# Patient Record
Sex: Female | Born: 1975 | Hispanic: Yes | Marital: Single | Smoking: Never smoker
Health system: Southern US, Community
[De-identification: ages and names within clinical notes are randomized; demographics above are authoritative.]

## PROBLEM LIST (undated history)

## (undated) ENCOUNTER — Emergency Department (HOSPITAL_COMMUNITY): Admission: EM | Payer: Self-pay | Source: Home / Self Care

## (undated) DIAGNOSIS — K219 Gastro-esophageal reflux disease without esophagitis: Secondary | ICD-10-CM

## (undated) DIAGNOSIS — M869 Osteomyelitis, unspecified: Secondary | ICD-10-CM

## (undated) DIAGNOSIS — O24419 Gestational diabetes mellitus in pregnancy, unspecified control: Secondary | ICD-10-CM

## (undated) HISTORY — DX: Osteomyelitis, unspecified: M86.9

## (undated) HISTORY — DX: Gestational diabetes mellitus in pregnancy, unspecified control: O24.419

## (undated) HISTORY — DX: Gastro-esophageal reflux disease without esophagitis: K21.9

## (undated) HISTORY — PX: LEG SURGERY: SHX1003

---

## 2008-02-03 HISTORY — PX: CHOLECYSTECTOMY: SHX55

## 2008-03-13 ENCOUNTER — Ambulatory Visit (HOSPITAL_COMMUNITY): Admission: RE | Admit: 2008-03-13 | Discharge: 2008-03-13 | Payer: Self-pay | Admitting: Obstetrics

## 2008-06-09 ENCOUNTER — Inpatient Hospital Stay (HOSPITAL_COMMUNITY): Admission: AD | Admit: 2008-06-09 | Discharge: 2008-06-13 | Payer: Self-pay | Admitting: Obstetrics

## 2008-12-03 ENCOUNTER — Encounter: Admission: RE | Admit: 2008-12-03 | Discharge: 2008-12-03 | Payer: Self-pay | Admitting: Specialist

## 2009-06-18 ENCOUNTER — Emergency Department (HOSPITAL_COMMUNITY): Admission: EM | Admit: 2009-06-18 | Discharge: 2009-06-19 | Payer: Self-pay | Admitting: Emergency Medicine

## 2009-06-24 ENCOUNTER — Inpatient Hospital Stay (HOSPITAL_COMMUNITY): Admission: EM | Admit: 2009-06-24 | Discharge: 2009-06-26 | Payer: Self-pay | Admitting: Emergency Medicine

## 2009-06-25 ENCOUNTER — Encounter (INDEPENDENT_AMBULATORY_CARE_PROVIDER_SITE_OTHER): Payer: Self-pay

## 2010-04-21 LAB — CBC
HCT: 37 % (ref 36.0–46.0)
HCT: 40.7 % (ref 36.0–46.0)
HCT: 41.8 % (ref 36.0–46.0)
Hemoglobin: 12.7 g/dL (ref 12.0–15.0)
Hemoglobin: 13.9 g/dL (ref 12.0–15.0)
MCHC: 34.1 g/dL (ref 30.0–36.0)
MCHC: 34.3 g/dL (ref 30.0–36.0)
MCV: 89 fL (ref 78.0–100.0)
MCV: 89.2 fL (ref 78.0–100.0)
Platelets: 281 10*3/uL (ref 150–400)
Platelets: 298 10*3/uL (ref 150–400)
Platelets: 306 10*3/uL (ref 150–400)
RBC: 4.68 MIL/uL (ref 3.87–5.11)
RDW: 12.8 % (ref 11.5–15.5)
RDW: 13 % (ref 11.5–15.5)
WBC: 12.1 10*3/uL — ABNORMAL HIGH (ref 4.0–10.5)
WBC: 14.3 10*3/uL — ABNORMAL HIGH (ref 4.0–10.5)

## 2010-04-21 LAB — COMPREHENSIVE METABOLIC PANEL
ALT: 10 U/L (ref 0–35)
ALT: 13 U/L (ref 0–35)
Albumin: 3.4 g/dL — ABNORMAL LOW (ref 3.5–5.2)
Alkaline Phosphatase: 65 U/L (ref 39–117)
Alkaline Phosphatase: 67 U/L (ref 39–117)
BUN: 3 mg/dL — ABNORMAL LOW (ref 6–23)
BUN: 4 mg/dL — ABNORMAL LOW (ref 6–23)
CO2: 25 mEq/L (ref 19–32)
Calcium: 8.6 mg/dL (ref 8.4–10.5)
Calcium: 8.7 mg/dL (ref 8.4–10.5)
Chloride: 110 mEq/L (ref 96–112)
GFR calc Af Amer: >60 mL/min (ref >60)
GFR calc Af Amer: >60 mL/min (ref >60)
GFR calc Af Amer: >60 mL/min (ref >60)
Glucose, Bld: 96 mg/dL (ref 70–99)
Potassium: 3.7 mEq/L (ref 3.5–5.1)
Sodium: 140 mEq/L (ref 135–145)
Total Bilirubin: 0.8 mg/dL (ref 0.3–1.2)
Total Bilirubin: 1 mg/dL (ref 0.3–1.2)
Total Protein: 6 g/dL (ref 6.0–8.3)
Total Protein: 6.4 g/dL (ref 6.0–8.3)

## 2010-04-21 LAB — POCT I-STAT, CHEM 8
BUN: 9 mg/dL (ref 6–23)
Calcium, Ion: 1.15 mmol/L (ref 1.12–1.32)
Chloride: 105 mEq/L (ref 96–112)
Creatinine, Ser: 0.5 mg/dL (ref 0.4–1.2)

## 2010-04-21 LAB — URINE MICROSCOPIC-ADD ON

## 2010-04-21 LAB — URINALYSIS, ROUTINE W REFLEX MICROSCOPIC
Bilirubin Urine: NEGATIVE
Glucose, UA: NEGATIVE mg/dL
Nitrite: NEGATIVE
Protein, ur: NEGATIVE mg/dL
Specific Gravity, Urine: 1.01 (ref 1.005–1.030)
Urobilinogen, UA: 0.2 mg/dL (ref 0.0–1.0)

## 2010-04-21 LAB — POCT PREGNANCY, URINE: Preg Test, Ur: NEGATIVE

## 2010-04-21 LAB — DIFFERENTIAL
Basophils Absolute: 0 10*3/uL (ref 0.0–0.1)
Basophils Relative: 0 % (ref 0–1)
Basophils Relative: 1 % (ref 0–1)
Eosinophils Relative: 1 % (ref 0–5)
Lymphs Abs: 2.3 10*3/uL (ref 0.7–4.0)
Monocytes Absolute: 0.8 10*3/uL (ref 0.1–1.0)
Neutro Abs: 7.5 10*3/uL (ref 1.7–7.7)
Neutrophils Relative %: 62 % (ref 43–77)

## 2010-04-21 LAB — LIPASE, BLOOD: Lipase: 28 U/L (ref 11–59)

## 2010-04-21 LAB — HEPATIC FUNCTION PANEL
Bilirubin, Direct: <0.1 mg/dL (ref 0.0–0.3)
Total Bilirubin: 0.5 mg/dL (ref 0.3–1.2)

## 2010-04-21 LAB — PREGNANCY, URINE: Preg Test, Ur: NEGATIVE

## 2010-05-13 LAB — CBC
Hemoglobin: 11.4 g/dL — ABNORMAL LOW (ref 12.0–15.0)
Hemoglobin: 12.9 g/dL (ref 12.0–15.0)
MCHC: 35.4 g/dL (ref 30.0–36.0)
MCHC: 35.5 g/dL (ref 30.0–36.0)
MCHC: 35.6 g/dL (ref 30.0–36.0)
MCV: 93.1 fL (ref 78.0–100.0)
MCV: 94.3 fL (ref 78.0–100.0)
MCV: 94.6 fL (ref 78.0–100.0)
Platelets: 227 10*3/uL (ref 150–400)
RBC: 3.29 MIL/uL — ABNORMAL LOW (ref 3.87–5.11)
RBC: 3.41 MIL/uL — ABNORMAL LOW (ref 3.87–5.11)
RDW: 12.9 % (ref 11.5–15.5)
WBC: 10.1 10*3/uL (ref 4.0–10.5)

## 2010-05-13 LAB — RPR: RPR Ser Ql: NONREACTIVE

## 2010-06-17 NOTE — Op Note (Signed)
NAME:  Monique Hutchinson, Monique Hutchinson NO.:  [id number]   MEDICAL RECORD NO.:  [id number]          PATIENT TYPE:  INP   LOCATION:  9311                          FACILITY:  [facility]   PHYSICIAN:  Kathreen Cosier, M.D.DATE OF BIRTH:  [birth year]   DATE OF PROCEDURE:  06/10/2008  DATE OF DISCHARGE:                               OPERATIVE REPORT   PREOPERATIVE DIAGNOSIS:  Failed induction at 41 weeks, failure to  progress in labor.   POSTOPERATIVE DIAGNOSIS:  Failed induction at 41 weeks, failure to  progress in labor.   SURGEON:  Kathreen Cosier, MD   ANESTHESIA:  Spinal.   PROCEDURE IN DETAIL:  The patient was placed on the operating room table  in supine position.  After the abdomen prepped and draped, bladder  emptied with Foley catheter, transverse suprapubic incision was made,  carried down to the rectus fascia.  Fascia was cleaned and incised at  the length of the incision.  Recti muscles retracted laterally.  Peritoneum incised longitudinally.  Transverse incision was made in the  visceral peritoneum above the bladder.  Bladder mobilized inferiorly.  Transverse lower uterine incision was made.  The patient delivered from  the OP position of a female, Apgar 9 and 9, weighing 5 pounds 4 ounces.  The placenta was posterior, removed manually and sent to Labor and  Delivery.  The uterine cavity was cleaned with dry laps.  Uterine  incision was closed in 1 layer with continuous suture of chromic.  Bladder flap reattached with 2-0 chromic.  Uterus well contracted.  Tubes and ovaries normal.  Abdomen was closed in layers, peritoneum with  continuous suture of 0 chromic, fascia with continuous suture of 0  Dexon, and the skin closed with subcuticular stitch of 4-0 plain.  Blood  loss 400 mL.  The patient tolerated the procedure well, taken to  recovery room in good condition.           ______________________________  Kathreen Cosier, M.D.     BAM/MEDQ  D:  06/11/2008   T:  06/11/2008  Job:  [id number]

## 2010-06-17 NOTE — H&P (Signed)
NAME:  ALAYLA, DETHLEFS NO.:  [id number]   MEDICAL RECORD NO.:  [id number]          PATIENT TYPE:  INP   LOCATION:  9128                          FACILITY:  [facility]   PHYSICIAN:  Kathreen Cosier, M.D.DATE OF BIRTH:  1975/09/09   DATE OF ADMISSION:  06/09/2008  DATE OF DISCHARGE:                              HISTORY & PHYSICAL   The patient is a 35 year old gravida 1, EDC Jun 03, 2008, was admitted at  41 weeks for induction.  The cervix was a fingertip, 50% in the vertex -  3, positive GBS.  Cytotec was inserted x2 and on May 9 at 6:30 a.m., she  was 2 cm, 70%, vertex, -3.  Membranes ruptured and IUPC inserted.  She  was on 18 milliunits of Pitocin over night and throughout the course of  the day, the patient was in adequate labor and by 11:00 p.m. on May 9,  she was still 3 cm, 90% with the vertex with a -3 station. There had  been no change and she has decided she would deliver by C-section for  failed induction, failure to progress in labor.   PHYSICAL EXAMINATION:  GENERAL:  A well-developed female in no distress.  HEENT:  Negative.  LUNGS:  Clear.  HEART:  Regular rhythm.  No murmurs, no gallops.  BREASTS:  No masses.  ABDOMEN:  Term size uterus.  EXTREMITIES:  She had previous surgery to her right shin, otherwise  negative.           ______________________________  Kathreen Cosier, M.D.     BAM/MEDQ  D:  06/11/2008  T:  06/11/2008  Job:  [id number]

## 2010-06-17 NOTE — Discharge Summary (Signed)
NAME:  Monique Hutchinson, SISTARE NO.:  [id number]   MEDICAL RECORD NO.:  [id number]          PATIENT TYPE:  INP   LOCATION:  9311                          FACILITY:  [facility]   PHYSICIAN:  Kathreen Cosier, M.D.DATE OF BIRTH:  [birth year]   DATE OF ADMISSION:  06/09/2008  DATE OF DISCHARGE:  06/13/2008                               DISCHARGE SUMMARY   The patient is a 35 year old gravida 1, EDC May 2, positive GBS,  admitted for induction on May 8.  Cervix was a fingertip, 50%, vertex, -  3.  Cytotec was inserted x2 and then she was started on Pitocin.  The  patient was in good labor and underwent primary low transverse cesarean  section because of failure to progress in labor.  She had a 5-pound 4-  ounce female, Apgar 9 and 9.  Postoperatively, she did well.  Her  hemoglobin was 11 and she was discharged on Jun 13, 2008, ambulatory on  a regular diet, on Tylox for pain, ferrous sulfate for anemia, to see me  in 6 weeks.   DISCHARGE DIAGNOSIS:  Status post primary low transverse cesarean  section at 41 weeks for failure to progress.           ______________________________  Kathreen Cosier, M.D.     BAM/MEDQ  D:  06/13/2008  T:  06/13/2008  Job:  [id number]

## 2012-02-03 NOTE — L&D Delivery Note (Signed)
Delivery Note At  a viable female was delivered via  (Presentation:LOA ;  ).  APGAR:6and8 NICU in attendance , ; weight .   Placenta status:spont via Duncan's mechanism , .  Cord:3 vc  with the following complications:none .  Cord pH: LOA  Anesthesia:  none Episiotomy: none Lacerations: none Suture Repair: n/a Est. Blood Loss (mL):   Mom to AICU.  Baby to NICU.  Monique Hutchinson,Monique Hutchinson 08/07/2012, 10:45 AM

## 2012-02-03 NOTE — L&D Delivery Note (Signed)
Attestation of Attending Supervision of Advanced Practitioner (CNM/NP): Evaluation and management procedures were performed by the Advanced Practitioner under my supervision and collaboration. I have reviewed the Advanced Practitioner's note and chart, and I agree with the management and plan.  Loreena Valeri H. 9:31 PM

## 2012-02-10 LAB — PREGNANCY, URINE: Preg Test, Ur: POSITIVE

## 2012-05-18 ENCOUNTER — Other Ambulatory Visit (HOSPITAL_COMMUNITY): Payer: Self-pay | Admitting: Physician Assistant

## 2012-05-18 DIAGNOSIS — Z3689 Encounter for other specified antenatal screening: Secondary | ICD-10-CM

## 2012-05-18 LAB — SICKLE CELL SCREEN: SICKLE CELL SCREEN: NORMAL

## 2012-05-18 LAB — OB RESULTS CONSOLE VARICELLA ZOSTER ANTIBODY, IGG
Varicella: IMMUNE
Varicella: IMMUNE

## 2012-05-18 LAB — GLUCOSE TOLERANCE, 1 HOUR (50G) W/O FASTING: GTT, 1 hr: 92

## 2012-05-18 LAB — OB RESULTS CONSOLE HIV ANTIBODY (ROUTINE TESTING): HIV: NONREACTIVE

## 2012-05-18 LAB — OB RESULTS CONSOLE PLATELET COUNT: Platelets: 262 10*3/uL

## 2012-05-19 LAB — OB RESULTS CONSOLE HEPATITIS B SURFACE ANTIGEN: Hepatitis B Surface Ag: NEGATIVE

## 2012-05-24 ENCOUNTER — Encounter (HOSPITAL_COMMUNITY): Payer: Self-pay

## 2012-05-24 ENCOUNTER — Ambulatory Visit (HOSPITAL_COMMUNITY)
Admission: RE | Admit: 2012-05-24 | Discharge: 2012-05-24 | Disposition: A | Discharge status: Home / Self Care | Payer: Self-pay | Source: Ambulatory Visit | Attending: Physician Assistant | Admitting: Physician Assistant

## 2012-05-24 DIAGNOSIS — O09529 Supervision of elderly multigravida, unspecified trimester: Secondary | ICD-10-CM | POA: Insufficient documentation

## 2012-05-24 DIAGNOSIS — Z1389 Encounter for screening for other disorder: Secondary | ICD-10-CM | POA: Insufficient documentation

## 2012-05-24 DIAGNOSIS — O358XX0 Maternal care for other (suspected) fetal abnormality and damage, not applicable or unspecified: Secondary | ICD-10-CM | POA: Insufficient documentation

## 2012-05-24 DIAGNOSIS — Z3689 Encounter for other specified antenatal screening: Secondary | ICD-10-CM

## 2012-05-24 DIAGNOSIS — Z363 Encounter for antenatal screening for malformations: Secondary | ICD-10-CM | POA: Insufficient documentation

## 2012-05-30 ENCOUNTER — Encounter: Payer: Self-pay | Admitting: *Deleted

## 2012-06-01 DIAGNOSIS — O09522 Supervision of elderly multigravida, second trimester: Secondary | ICD-10-CM

## 2012-06-01 DIAGNOSIS — O09529 Supervision of elderly multigravida, unspecified trimester: Secondary | ICD-10-CM | POA: Insufficient documentation

## 2012-06-01 DIAGNOSIS — O34219 Maternal care for unspecified type scar from previous cesarean delivery: Secondary | ICD-10-CM | POA: Insufficient documentation

## 2012-06-01 NOTE — Progress Notes (Signed)
Referred from health department Smoking screen done( negative), Domestic Violence screen done( negative), Psychosocial screen(negative)done,

## 2012-06-02 ENCOUNTER — Ambulatory Visit (INDEPENDENT_AMBULATORY_CARE_PROVIDER_SITE_OTHER): Payer: Self-pay | Admitting: Family Medicine

## 2012-06-02 ENCOUNTER — Encounter: Payer: Self-pay | Admitting: Family Medicine

## 2012-06-02 VITALS — BP 116/75 | Temp 97.4°F | Ht <= 58 in | Wt 130.4 lb

## 2012-06-02 DIAGNOSIS — M89669 Osteopathy after poliomyelitis, unspecified lower leg: Secondary | ICD-10-CM

## 2012-06-02 DIAGNOSIS — A809 Acute poliomyelitis, unspecified: Secondary | ICD-10-CM

## 2012-06-02 DIAGNOSIS — B91 Sequelae of poliomyelitis: Secondary | ICD-10-CM

## 2012-06-02 DIAGNOSIS — O09299 Supervision of pregnancy with other poor reproductive or obstetric history, unspecified trimester: Secondary | ICD-10-CM | POA: Insufficient documentation

## 2012-06-02 DIAGNOSIS — O09529 Supervision of elderly multigravida, unspecified trimester: Secondary | ICD-10-CM

## 2012-06-02 DIAGNOSIS — M869 Osteomyelitis, unspecified: Secondary | ICD-10-CM

## 2012-06-02 DIAGNOSIS — O09522 Supervision of elderly multigravida, second trimester: Secondary | ICD-10-CM

## 2012-06-02 DIAGNOSIS — O0990 Supervision of high risk pregnancy, unspecified, unspecified trimester: Secondary | ICD-10-CM | POA: Insufficient documentation

## 2012-06-02 DIAGNOSIS — O09292 Supervision of pregnancy with other poor reproductive or obstetric history, second trimester: Secondary | ICD-10-CM

## 2012-06-02 HISTORY — DX: Osteomyelitis, unspecified: M86.9

## 2012-06-02 LAB — POCT URINALYSIS DIP (DEVICE)
Hgb urine dipstick: NEGATIVE
Protein, ur: NEGATIVE mg/dL
Specific Gravity, Urine: 1.02 (ref 1.005–1.030)
Urobilinogen, UA: 0.2 mg/dL (ref 0.0–1.0)
pH: 7 (ref 5.0–8.0)

## 2012-06-02 NOTE — Patient Instructions (Addendum)
Embarazo - Segundo trimestre (Pregnancy - Second Trimester) El segundo trimestre del Psychiatrist (del 3 al ) es un perodo de evolucin rpida para usted y el beb. Hacia el final del sexto mes, el beb mide aproximadamente 23 cm y pesa 680 g. Comenzar a Pharmacologist del beb [place] 18 y las 20 [address] de [place]. Podr sentir las pataditas ("quickening en ingls"). Hay un rpido [employer]. Puede segregar un lquido claro Charity fundraiser) de las [place]. Quizs sienta pequeas contracciones en el vientre (tero) Esto se conoce como falso trabajo de parto o contracciones de Braxton-H[place]. Es como una prctica del trabajo de parto que se produce cuando el beb est listo para salir. Generalmente los problemas de vmitos matinales ya se han superado hacia el final del Medical sales representative. Algunas mujeres desarrollan pequeas manchas oscuras (que se denominan cloasma, mscara del embarazo) en la cara que normalmente se van luego del nacimiento del beb. La exposicin al sol empeora las manchas. Puede desarrollarse acn en algunas mujeres embarazadas, y puede desaparecer en aquellas que ya tienen acn. EXAMENES PRENATALES  Durante los Manpower Inc, deber seguir realizando pruebas de [place], segn avance el [place]. Estas pruebas se realizan para controlar su salud y la del beb. Tambin se realizan anlisis de sangre para [employer] niveles de [place]. La anemia (bajo nivel de hemoglobina) es frecuente durante el embarazo. Para prevenirla, se administran hierro y vitaminas. Tambin se le realizarn exmenes para saber si tiene diabetes entre las 24 y las 28 semanas del [place]. Podrn repetirle algunas de las Hovnanian Enterprises hicieron previamente.  En cada visita le medirn el tamao del tero. Esto se realiza para asegurarse de que el beb est creciendo correctamente de acuerdo al estado del [place].  Tambin en cada visita prenatal controlarn su presin arterial. Esto se realiza  para asegurarse de que no tenga toxemia.  Se controlar su orina para asegurarse de que no tenga infecciones, diabetes o protena en la orina.  Se controlar su peso regularmente para asegurarse que el aumento ocurre al ritmo indicado. Esto se hace para asegurarse que usted y el beb tienen una evolucin normal.  En algunas ocasiones se realiza una prueba de ultrasonido para confirmar el correcto desarrollo y evolucin del beb. Esta prueba se realiza con ondas sonoras inofensivas para el beb, de modo que el profesional pueda calcular ms precisamente la fecha del [place]. Algunas veces se realizan pruebas especializadas del lquido amnitico que rodea al beb. Esta prueba se denomina amniocentesis. El lquido amnitico se obtiene introduciendo una aguja en el vientre (abdomen). Se realiza para Conservator, museum/gallery en los que existe alguna preocupacin acerca de algn problema gentico que pueda sufrir el beb. En ocasiones se lleva a cabo cerca del final del embarazo, si es necesario inducir al Apple Computer. En este caso se realiza para asegurarse que los pulmones del beb estn lo suficientemente maduros como para que pueda vivir fuera del tero. CAMBIOS QUE OCURREN EN EL SEGUNDO TRIMESTRE DEL EMBARAZO Su organismo atravesar numerosos cambios durante el Big Lots. Estos pueden variar de Neomia Dear persona a otra. [place] con el profesional que la asiste acerca los cambios que usted note y que la preocupen.  Durante el segundo trimestre probablemente sienta un aumento del apetito. Es normal tener "antojos" de Development worker, community. Esto vara de Neomia Dear persona a otra y de un embarazo a Therapist, art.  El abdomen inferior comenzar a abultarse.  Podr tener la necesidad de Geographical information systems officer con ms frecuencia debido a que  el tero y el beb presionan sobre la vejiga. Tambin es frecuente contraer ms infecciones urinarias durante el embarazo (dolor al ConocoPhillips). Puede evitarlas bebiendo gran cantidad de lquidos y vaciando  la vejiga antes y despus de Sales promotion account executive.  Podrn aparecer las primeras estras en las caderas, abdomen y [place]. Estos son cambios normales del cuerpo durante el [place]. No existen medicamentos ni ejercicios que puedan prevenir CarMax.  Es posible que comience a desarrollar venas inflamadas y abultadas (varices) en las piernas. El uso de medias de descanso, Optometrist sus pies durante 15 minutos, 3 a 4 veces al da y Film/video editor la sal en su dieta ayuda a Journalist, newspaper.  Podr sentir Engineering geologist gstrica a medida que el tero crece y Doctor, general practice. Puede tomar anticidos, con la autorizacin de su mdico, para Financial planner. Tambin es til ingerir pequeas comidas 4 a 5 veces al Futures trader.  La constipacin puede tratarse con un laxante o agregando fibra a su dieta. Beber grandes cantidades de lquidos, comer vegetales, frutas y granos integrales es de [place].  Tambin es beneficioso practicar actividad fsica. Si ha sido una persona Engineer, mining, podr continuar con la Harley-Davidson de las actividades durante el mismo. Si ha sido [employer], puede ser beneficioso que comience con un programa de ejercicios, Museum/gallery exhibitions officer.  Puede desarrollar hemorroides (vrices en el recto) hacia el final del segundo trimestre. Tomar baos de asiento tibios y Chemical engineer cremas recomendadas por el profesional que lo asiste sern de ayuda para los problemas de hemorroides.  Tambin podr Financial risk analyst de espalda durante este momento de su embarazo. Evite levantar objetos pesados, utilice zapatos de taco bajo y [place] buena postura para ayudar a reducir los problemas de [place].  Algunas mujeres embarazadas desarrollan hormigueo y adormecimiento de la mano y los dedos debido a la hinchazn y compresin de los ligamentos de la mueca (sndrome del tnel carpiano). Esto desaparece una vez que el beb nace.  Como sus pechos se agrandan, Pension scheme manager un sujetador  ms grande. Use un sostn de soporte, cmodo y de algodn. No utilice un sostn para amamantar hasta el ltimo mes de embarazo si va a amamantar al beb.  Podr observar una lnea oscura desde el ombligo hacia la zona pbica denominada linea nigra.  Podr observar que sus mejillas se ponen coloradas debido al aumento de flujo sanguneo en la cara.  Podr desarrollar "araitas" en la cara, cuello y pecho. Esto desaparece una vez que el beb nace. INSTRUCCIONES PARA EL CUIDADO DOMICILIARIO  Es extremadamente importante que evite el cigarrillo, hierbas medicinales, alcohol y las drogas no prescriptas durante el Psychiatrist. Estas sustancias qumicas afectan la formacin y el desarrollo del beb. Evite estas sustancias durante todo el embarazo para asegurar el nacimiento de un beb sano.  La mayor parte de los cuidados que se aconsejan son los mismos que los indicados para Financial risk analyst trimestre del Psychiatrist. Cumpla con las citas tal como se le indic. Siga las instrucciones del profesional que lo asiste con respecto al uso de los medicamentos, el ejercicio y Psychologist, forensic.  Durante el embarazo debe obtener nutrientes para usted y para su beb. Consuma alimentos balanceados a intervalos regulares. Elija alimentos como carne, pescado, [place] y otros productos lcteos descremados, vegetales, frutas, panes integrales y cereales. El Equities trader cul es el aumento de peso ideal.  Las relaciones sexuales fsicas pueden continuarse hasta cerca del fin del embarazo si no existen otros problemas. Estos  problemas pueden ser la prdida temprana (prematura) de lquido amnitico de las [place], sangrado vaginal, dolor abdominal u otros problemas mdicos o del Psychiatrist.  Realice Tesoro Corporation, si no tiene restricciones. Consulte con el profesional que la asiste si no sabe con certeza si determinados ejercicios son seguros. El mayor aumento de peso tiene Environmental consultant durante los ltimos 2 trimestres del  Psychiatrist. El ejercicio la ayudar a:  Engineering geologist.  Ponerla en forma para el parto.  Ayudarla a perder peso luego de haber dado a luz.  Use un buen sostn o como los que se usan para hacer deportes para Paramedic la sensibilidad de las [place]. Tambin puede serle til si lo [place] mientras duerme. Si pierde Product manager, podr Parker Hann[employer]in.  No utilice la baera con agua caliente, baos turcos y saunas durante el [address]  Utilice el cinturn de seguridad sin excepcin cuando conduzca. Este la proteger a usted y al beb en caso de accidente.  Evite comer carne cruda, queso crudo, y el contacto con los utensilios y desperdicios de los gatos. Estos elementos contienen grmenes que pueden causar defectos de nacimiento en el beb.  El segundo trimestre es un buen momento para visitar a su dentista y Software engineer si an no lo ha hecho. Es Primary school teacher los dientes limpios. Utilice un cepillo de dientes blando. Cepllese ms suavemente durante el embarazo.  Es ms fcil perder algo de orina durante el [place]. Apretar y Chief Operating Officer los msculos de la pelvis la ayudar con este problema. Practique detener la miccin cuando est en el bao. Estos son los mismos msculos que Development worker, international aid. Son TEPP[employer] Partners mismos msculos que utiliza cuando trata de Ryder System gases. Puede practicar apretando estos msculos 10 veces, y repetir esto 3 veces por da aproximadamente. Una vez que conozca qu msculos debe apretar, no realice estos ejercicios durante la miccin. Puede favorecerle una infeccin si la orina vuelve hacia atrs.  Pida ayuda si tiene necesidades econmicas, de asesoramiento o nutricionales durante el [place]. El profesional podr ayudarla con respecto a estas necesidades, o derivarla a otros especialistas.  La piel puede ponerse grasa. Si esto sucede, lvese la cara con un jabn [place], utilice un humectante no graso y [place] con base de aceite o  crema. CONSUMO DE MEDICAMENTOS Y DROGAS DURANTE EL EMBARAZO  Contine tomando las vitaminas apropiadas para esta etapa tal como se le indic. Las vitaminas deben contener un miligramo de cido flico y deben suplementarse con hierro. Guarde todas las vitaminas fuera del alcance de los nios. La ingestin de slo un par de vitaminas o tabletas que contengan hierro puede ocasionar la New[employer] Mining en un beb o en un nio pequeo.  Evite el uso de [place], inclusive los de venta libre y hierbas que no hayan sido prescriptos o indicados por el profesional que la asiste. Algunos medicamentos pueden causar problemas fsicos al beb. Utilice los medicamentos de venta libre o de prescripcin para Chief Technology Officer, Environmental health practitioner o la [place], segn se lo indique el profesional que lo asiste. No utilice aspirina.  El consumo de alcohol est relacionado con ciertos defectos de nacimiento. Esto incluye el sndrome de alcoholismo fetal. Debe evitar el consumo de alcohol en cualquiera de sus formas. El cigarrillo causa nacimientos prematuros y bebs de [place]. El uso de drogas recreativas est absolutamente prohibido. Son muy nocivas para el beb. Un beb que nace de American Express, ser adicto al nacer. Ese beb tendr los mismos  sntomas de abstinencia que un adulto.  Infrmele al profesional si consume alguna droga.  No consuma drogas ilegales. Pueden causarle mucho dao al beb. SOLICITE ATENCIN MDICA SI: Tiene preguntas o preocupaciones durante su embarazo. Es mejor que llame para Science writer las dudas que esperar hasta su prxima visita prenatal. Thressa Sheller forma se sentir ms tranquila.  SOLICITE ATENCIN MDICA DE INMEDIATO SI:  La temperatura oral se eleva sin motivo por encima de 102 F (38.9 C) o segn le indique el profesional que lo asiste.  Tiene una prdida de lquido por la vagina (canal de parto). Si sospecha una ruptura de las Fabrica, tmese la temperatura y llame al profesional para informarlo sobre  esto.  Observa unas pequeas manchas, una hemorragia vaginal o elimina cogulos. Notifique al profesional acerca de la cantidad y de cuntos apsitos est utilizando. Unas pequeas manchas de sangre son algo comn durante el Psychiatrist, especialmente despus de Sales promotion account executive.  Presenta un olor desagradable en la secrecin vaginal y observa un cambio en el color, de transparente a blanco.  Contina con las nuseas y no obtiene alivio de los remedios indicados. Vomita sangre o algo similar a la borra del caf.  [place] o sube ms de 900 g. en una semana, o segn lo indicado por el profesional que la asiste.  Observa que se le Southwest Airlines, las manos, los pies o las piernas.  Ha estado expuesta a la rubola y no ha sufrido la enfermedad.  Ha estado expuesta a la quinta enfermedad o a la varicela.  Presenta dolor abdominal. Las molestias en el ligamento redondo son Neomia Dear causa no cancerosa (benigna) frecuente de dolor abdominal durante el embarazo. El profesional que la asiste deber evaluarla.  Presenta dolor de cabeza intenso que no se [place].  Presenta fiebre, diarrea, dolor al orinar o le falta la respiracin.  Presenta dificultad para ver, visin borrosa, o visin doble.  Sufre una cada, un accidente de trnsito o cualquier tipo de trauma.  Vive en un hogar en el que existe violencia fsica o mental. Document Released: 10/29/2004 Document Revised: 04/13/2011 [facility] Patient Information 2013 [place], [place].  Lactancia materna  (Breastfeeding) Decidir Museum/gallery exhibitions officer es una de las mejores elecciones que puede hacer por usted y su beb. La informacin que se brinda a Psychologist, clinical dar una breve visin de los beneficios de la lactancia materna as como de las dudas ms frecuentes alrededor de ella.  LOS BENEFICIOS DE AMAMANTAR  Para el beb   La primera leche (calostro ) ayuda al mejor funcionamiento del sistema digestivo del beb.   La leche tiene anticuerpos que  provienen de la madre y que ayudan a prevenir las infecciones en el beb.   El beb tiene una menor incidencia de asma, alergias y del sndrome de muerte sbita del lactante (SMSL).   Los nutrientes en la [place] materna son mejores para el beb que los preparados para lactantes y la [place] materna ayuda a un mejor desarrollo del cerebro del beb.   Los bebs amamantados tienen menos gases, clicos y estreimiento.  Para la mam   La lactancia materna favorece el desarrollo de un vnculo muy especial entre la madre y el beb.   Es ms conveniente, siempre disponible y a Landscape architect y Film/video editor.   Consume caloras en la madre y la ayuda a perder el peso ganado durante el [place].   Favorece la contraccin del tero a su tamao normal, de manera ms rpida y [employer]away las hemorragias luego del  parto.   Las [employer].C[employer] Holdings que amamantan tienen menor riesgo de Geophysical data processor de mama.  FRECUENCIA DEL AMAMANTAMIENTO   Un beb sano, nacido a trmino, puede amamantarse con tanta frecuencia como cada hora, o espaciar las comidas cada tres horas.   Observe al beb cuando manifieste signos de hambre. Amamante a su beb si muestra signos de hambre. Esta frecuencia variar de un beb a otro.   Amamntelo tan seguido como el beb lo solicite, o cuando usted sienta la necesidad de Paramedic sus [place].   Despierte al beb si han pasado 3  4 horas desde la ltima comida.   El amamantamiento frecuente la ayudar a producir ms [place] y a Education officer, community de Engineer, mining en los pezones e hinchazn de las [place].  POSICIN DEL BEBE PARA EL AMAMANTAMIENTO   Ya sea que se encuentre [place] o sentada, asegrese que el abdomen del beb enfrente el suyo.   Sostenga la mama con el pulgar por arriba y los otros 4 dedos por debajo. Asegrese que sus dedos se encuentren lejos del pezn y de la boca del beb.   Empuje suavemente los labios del beb con el pezn o con el dedo.   Cuando la boca  del beb se abra lo suficiente, introduzca el pezn y la areola tanto como le sea posible dentro de la boca.   Coloque al beb cerca suyo de modo que su nariz y mejillas toquen las mamas al [employer].  ALIMENTACIN Y SUCCIN   La duracin de cada comida vara de un beb a otro y de Neomia Dear comida a [place].   El beb debe succionar entre 2 y 3 minutos para que Development worker, international aid. Esto se denomina "bajada". Por este motivo, permita que el nio se alimente en cada mama todo lo que desee. Terminar de mamar cuando haya recibido la cantidad [place] de nutrientes.   Para detener la succin coloque su dedo en la comisura de la boca del nio y Midwife entre sus encas antes de quitarle la mama de la boca. Esto la ayudar a English as a second language teacher.  COMO SABER SI EL BEB OBTIENE LA SUFICIENTE LECHE MATERNA  Preguntarse si el beb obtiene la cantidad suficiente de [place] es una preocupacin frecuente [employer]. Puede asegurarse que el beb tiene la leche suficiente si:   El beb succiona activamente y usted escucha que traga .   El beb parece estar relajado y satisfecho despus de Psychologist, clinical.   El nio se alimenta al menos 8 a 12 veces en 24 horas. Alimntelo hasta que se desprenda por sus propios medios o se quede dormido en la primera mama (al menos durante 10 a 20 minutos), luego ofrzcale el otro lado.   El beb moja 5 a 6 paales desechables (6 a 8 paales de tela) en 24 horas cuando tiene 5  6 das de vida.   Tiene al Lowe's Companies 3 a 4 deposiciones todos los B[employer]on[employer] Dickinson and Company primeros meses. La materia fecal debe ser blanda y [place].   El beb debe aumentar 4 a 6 libras (120 a 170 gr.) por semana despus de los 4 [address] de vida.   Siente que las mamas se ablandan despus de amamantar  REDUCIR LA CONGESTIN DE LAS MAMAS   Durante la primera semana despus del [place], usted puede experimentar hinchazn en las mamas. Cuando las mamas estn congestionadas, se sienten calientes, llenas y  molestas al tacto. Puede reducir la congestin si:   Lo amamanta frecuentemente, cada 2-3  horas. Las mams que [employer] Corporation pronto y con frecuencia tienen menos problemas de [place].   Coloque compresas de hielo en sus mamas durante 10-20 minutos entre cada amamantamiento. Esto ayuda a Building services engineer. Envuelva las bolsas de hielo en una toalla liviana para proteger su piel. Las bolsas de vegetales congelados funcionan bien para este propsito.   Tome una ducha tibia o aplique compresas hmedas calientes en las mamas durante 5 a 10 minutos antes de cada vez que [place]. Esto aumenta la circulacin y [place] a que la [place].   Masajee suavemente la mama antes y Psychologist, sport and exercise. Con las puntas de los dedos, masajee desde la pared torcica hacia abajo hasta llegar al pezn, con movimientos circulares.   Asegrese que el nio vaca al menos una mama antes de cambiar de lado.   Use un sacaleche para vaciar la mama si el beb se duerme o no se alimenta bien. Tambin podr Phelps Dodge con esa bomba si tiene que volver al trabajo o siente que las mamas estn congestionadas.   Evite los biberones, chupetes o complementar la alimentacin con agua o jugos en lugar de la [place]. La leche materna es todo el alimento que el beb necesita. No es necesario que el nio ingiera agua o preparados de bibern. Louann Liv, es lo mejor para ayudar a que las mamas produzcan ms [place]. no darle suplementos al Bank [employer] America las primeras semanas.   Verifique que el beb se encuentra en la posicin correcta mientras lo alimenta.   Use un sostn que soporte bien sus mamas y State [employer] Corporation que tienen aro.   Consuma una dieta balanceada y beba lquidos en cantidad.   Descanse con frecuencia, reljese y tome sus vitaminas prenatales para evitar la fatiga, el estrs y la anemia.  Si sigue estas indicaciones, la congestin debe mejorar en 24 a 48 horas. Si an tiene dificultades, consulte a Nurse, adult.  CUDESE USTED MISMA  Cuide sus mamas.   Bese o dchese diariamente.   Evite usar [employer]aton Corporation.   Comience a amamantar del lado izquierdo en una comida y del lado derecho en la siguiente.   Notar que aumenta el flujo de [place] a los 2 a 5 [address] despus del [address]. Puede sentir algunas molestias por la congestin, lo que hace que sus mamas estn duras y sensibles. La congestin disminuye en 24 a 48 horas. Mientras tanto, aplique toallas hmedas calientes durante 5 a 10 minutos antes de amamantar. Un masaje suave y la extraccin de un poco de leche antes de Museum/gallery exhibitions officer ablandarn las mamas y har ms fcil que el beb se agarre.   Use un buen sostn y seque al aire los pezones durante 3 a 4 minutos luego de Wellsite geologist.   Solo utilice apsitos de algodn.   Utilice lanolina [employer] International pezones luego de [place]. No necesita lavarlos luego de alimentar al [employer]raw[employer]Hill. Otra opcin es exprimir algunas gotas de [place] y [employer] Holdings pezones.  Cumpla con estos cuidados   Consuma alimentos bien balanceados y refrigerios nutritivos.   Dixie Dials, jugos de fruta y agua para Warehouse manager sed (alrededor de 8 vasos por Futures trader).   Descanse lo suficiente.  Evite los alimentos que usted note que pueden afectar al beb.  SOLICITE ATENCIN MDICA SI:   Tiene dificultad con la lactancia materna y [place].   Tiene una zona de color rojo, dura y dolorosa en la mama que se acompaa  de fiebre.   El beb est muy somnoliento como para alimentarse bien o tiene problemas para dormir.   Su beb moja menos de 6 paales al [address], a los 5 [address]  [address] de vida.   La piel del beb o la parte blanca de sus ojos est ms amarilla de lo que estaba en el hospital.   Se siente deprimida.  Document Released: 01/19/2005 Document Revised: 07/21/2011 [facility] Patient Information 2013 [place], [place]. Parto vaginal luego de una cesrea (Vaginal Birth After Cesarean  Delivery) Un parto vaginal luego de un parto por cesrea es dar a luz por la vagina luego de haber dado a luz por medio de una intervencin [place]. En el pasado, si una mujer tena un beb por cesrea, todos los partos posteriores deban hacerse por cesrea. Esto ya no es as. Puede ser seguro para la mam intentar un parto vaginal luego de una cesrea. La decisin final de tener un parto vaginal o por cesrea debe tomarse en conjunto, entre la paciente y el mdico. [place] riesgos y los beneficios debern evaluarse con relacin a los motivos y al tipo de cesrea previa LAS MUJERES QUE QUIEREN TENER UN PARTO VAGINAL, DEBEN CONSULTAR CON SU MDICO PARA ASEGURARSE QUE:  La cesrea anterior se realiz con una incisin uterina transversal (no con una incisin vertical clsica).  El canal de parto es lo suficientemente grande como para que pase el [place].  No ha sido sometida a otras operaciones del tero.  Durante el trabajo de parto le realizarn un monitoreo electrnico fetal, en todo momento.  Es necesario que haya un quirfano disponible y listo en caso de necesitar una cesrea de emergencia.  Un cirujano y personal de quirfano estarn disponibles en todo momento durante el [place] de parto, para realizar una cesrea en caso de ser necesario.  Habr un anestesista disponible en caso de necesitar una cesrea de emergencia.  La nursery est lista cuenta con personal especializado y el equipo disponible para cuidar al beb en caso de emergencia. BENEFICIOS  Permanencia ms breve en el hospital.  Menores costos en el parto, la nurse y el hospital.  Menos prdida de sangre y menos probabilidad de necesitar una transfusin sangunea.  Menos probabilidad de tener fiebre o molestias como consecuencia de una ciruga mayor  Menos riesgo de cogulos sanguneos.  Menos riesgo de sufrir infecciones.  Recuperacin ms rpida luego del alta mdica.  Menos riesgo de complicaciones quirrgicas, como  apertura o hernia de la incisin.  Disminucin del riesgo de lesiones a otros rganos  Menor riesgo de remocin del tero (histerectoma)  Menor riesgo de que la placenta cubra parcial o completamente la abertura del tero (placenta previa) en embarazos futuros  Posibilidad de tener una familia grande, si lo desea. RIESGOS  Ruptura del tero.  Si el tero se rompe Production manager.  Todas las complicaciones de [place] mayor y lesiones en otros rganos.  Hemorragia excesiva, cogulos e infeccin.  [place] Apgar Puntuacin Apgar baja (mtodo que evala al recin nacido segn su apariencia, pulso, muecas, actividad y respiracion) y ms riesgos para el beb.  Hay mas riesgo de ruptura del tero si se induce o aumenta el trabajo de [place].  Hay un mayor riesgo de ruptura uterina si se usan medicamentos para madurar el cuello. NO DEBE LLEVARSE A CABO SI:  La cesrea previa se realiz con una incicin vertical (clsica) o con forma de T, o usted no sabe cul de [employer] han practicado.  Ha sufrido ruptura  del tero.  Le han practicado una ciruga de tero.  Tiene problemas mdicos u obsttricos.  El beb est en problemas.  Tuvo dos cesreas previas y ningn parto vaginal. OTRAS COSAS QUE DEBE SABER:  La anestesia peridural es segura.  Es seguro dar vuelta al beb si se encuentra de nalgas (intentar una versin ceflica externa).  Es seguro intentarlo en caso de mellizos.  Los embarazos de ms de 40 semanas no tienen xito con este procedimiento.  Hay un aumento de fracasos en embarazadas obesas.  Hay un aumento del porcentaje de fracasos si el beb pesa 4 Kg o ms.  Hay aumento en el porcentaje de fracasos si el intervalo entre la operacin cesrea y el parto vaginal es de menos de 19 meses.  Hay un aumento en el porcentaje de fracasos si ha sufrido preeclampsia hipertensin arterial, protenas en la orina e hinchazn del rostro y las extremidades.  El  parto vaginal ser muy exitoso si tuvo un parto vaginal previo.  Tambin es [employer] [facility] caso que el [place] de parto comience espontneamente antes de la fecha.  El parto vaginal luego de Neomia Dear cesrea es similar a un parto espontneo vaginal normal. Es importante que converse con su mdico desde comienzos del Psychiatrist de modo que pueda [employer], beneficios y opciones. De este modo tendr tiempo de decidir que es lo mejor en su caso particular en relacin a su parto por cesrea anterior. Hay que tener en cuenta que puede haber cambios en la madre durante el [place], lo que hace necesario cambiar su decisin o la del mdico. Los consejos, preocupaciones y decisiones debern documentarse en la historia clnica y debe ser firmada por todas las partes. Document Released: 07/08/2007 Document Revised: 04/13/2011 [facility] Patient Information 2013 [place], [place].

## 2012-06-03 NOTE — Progress Notes (Signed)
Transfer from HD for h/o SGA, previous C-section, for failed induction on OP baby, thought to be 41 wks, but really only 37, with SGA baby and due date change. Considering TOLAC.  Watch FH, baby 1wk behind at anatomy U/S.

## 2012-06-08 ENCOUNTER — Encounter: Payer: Self-pay | Admitting: *Deleted

## 2012-06-08 DIAGNOSIS — O34219 Maternal care for unspecified type scar from previous cesarean delivery: Secondary | ICD-10-CM

## 2012-06-08 DIAGNOSIS — O09292 Supervision of pregnancy with other poor reproductive or obstetric history, second trimester: Secondary | ICD-10-CM

## 2012-06-08 DIAGNOSIS — O09522 Supervision of elderly multigravida, second trimester: Secondary | ICD-10-CM

## 2012-06-08 DIAGNOSIS — O0990 Supervision of high risk pregnancy, unspecified, unspecified trimester: Secondary | ICD-10-CM

## 2012-06-23 ENCOUNTER — Ambulatory Visit (INDEPENDENT_AMBULATORY_CARE_PROVIDER_SITE_OTHER): Payer: Self-pay | Admitting: Family Medicine

## 2012-06-23 VITALS — BP 126/65 | Temp 97.0°F | Wt 134.1 lb

## 2012-06-23 DIAGNOSIS — O0990 Supervision of high risk pregnancy, unspecified, unspecified trimester: Secondary | ICD-10-CM

## 2012-06-23 DIAGNOSIS — O09529 Supervision of elderly multigravida, unspecified trimester: Secondary | ICD-10-CM

## 2012-06-23 DIAGNOSIS — IMO0002 Reserved for concepts with insufficient information to code with codable children: Secondary | ICD-10-CM

## 2012-06-23 DIAGNOSIS — O09523 Supervision of elderly multigravida, third trimester: Secondary | ICD-10-CM

## 2012-06-23 DIAGNOSIS — O365990 Maternal care for other known or suspected poor fetal growth, unspecified trimester, not applicable or unspecified: Secondary | ICD-10-CM

## 2012-06-23 DIAGNOSIS — Z8759 Personal history of other complications of pregnancy, childbirth and the puerperium: Secondary | ICD-10-CM | POA: Insufficient documentation

## 2012-06-23 LAB — CBC
HCT: 32.7 % — ABNORMAL LOW (ref 36.0–46.0)
Hemoglobin: 11 g/dL — ABNORMAL LOW (ref 12.0–15.0)
RBC: 3.98 MIL/uL (ref 3.87–5.11)
WBC: 8.6 10*3/uL (ref 4.0–10.5)

## 2012-06-23 LAB — POCT URINALYSIS DIP (DEVICE)
Bilirubin Urine: NEGATIVE
Hgb urine dipstick: NEGATIVE
Ketones, ur: NEGATIVE mg/dL
Specific Gravity, Urine: 1.025 (ref 1.005–1.030)
pH: 7 (ref 5.0–8.0)

## 2012-06-23 NOTE — Progress Notes (Signed)
U/S scheduled 06/28/12 at 815 am.

## 2012-06-23 NOTE — Progress Notes (Signed)
Right ankle swelling, goes away when walking but when sitting it swells worse. Swelling and numbness in hands.

## 2012-06-23 NOTE — Patient Instructions (Addendum)
Embarazo  Tercer trimestre  (Pregnancy - Third Trimester) El tercer trimestre del embarazo (los ltimos 3 meses) es el perodo en el cual tanto usted como su beb crecen con ms rapidez. El beb alcanza un largo de aproximadamente 50 cm. y pesa entre 2,700 y 4,500 kg. El beb gana ms tejido graso y est listo para la vida fuera del cuerpo de la madre. Mientras estn en el interior, los bebs tienen perodos de sueo y vigilia, succionan el pulgar y tienen hipo. Quizs sienta pequeas contracciones del tero. Este es el falso trabajo de parto. Tambin se las conoce como contracciones de Braxton-Hicks . Es como una prctica del parto. Los problemas ms habituales de esta etapa del embarazo incluyen mayor dificultad para respirar, hinchazn de las manos y los pies por retencin de lquidos y la necesidad de orinar con ms frecuencia debido a que el tero y el beb presionan sobre la vejiga.  EXAMENES PRENATALES   Durante los exmenes prenatales, deber seguir realizndose anlisis de sangre. Estas pruebas se realizan para controlar su salud y la del beb. Los anlisis de sangre se realizan para conocer los niveles de algunos compuestos de la sangre (hemoglobina). La anemia (bajo nivel de hemoglobina) es frecuente durante el embarazo. Para prevenirla, se administran hierro y vitaminas. Tambin le tomarn nuevas anlisis para descartar diabetes. Podrn repetirle algunas de las pruebas que le hicieron previamente.  En cada visita le medirn el tamao del tero. Esto permite asegurar que el beb se desarrolla adecuadamente, segn la fecha del embarazo.  Le controlarn la presin arterial en cada visita prenatal. Esto es para asegurarse de que no sufre toxemia.  Le harn un anlisis de orina en cada visita prenatal, para descartar infecciones, diabetes y la presencia de protenas.  Tambin en cada visita controlarn su peso. Esto se realiza para asegurarse que aumenta de peso al ritmo indicado y que usted y su  beb evolucionan normalmente.  En algunas ocasiones se realiza una prueba de ultrasonido para confirmar el correcto desarrollo y evolucin del beb. Esta prueba se realiza con ondas sonoras inofensivas para el beb, de modo que el profesional pueda calcular ms precisamente la fecha del parto.  Analice con su mdico los analgsicos y la anestesia que recibir durante el trabajo de parto y el parto.  Comente la posibilidad de que necesite una cesrea y qu anestesia se recibir.  Informe a su mdico si sufre violencia familiar mental o fsica. A veces, se indica la prueba especializada sin estrs, la prueba de tolerancia a las contracciones y el perfil biofsico para asegurarse de que el beb no tiene problemas. El estudio del lquido amnitico que rodea al beb se llama amniocentesis. El lquido amnitico se obtiene introduciendo una aguja en el vientre (abdomen ). En ocasiones se lleva a cabo cerca del final del embarazo, si es necesario inducir a un parto. En este caso se realiza para asegurarse que los pulmones del beb estn lo suficientemente maduros como para que pueda vivir fuera del tero. Si los pulmones no han madurado y es peligroso que el beb nazca, se administrar a la madre una inyeccin de cortisona , 1 a 2 das antes del parto. . Esto ayuda a que los pulmones del beb maduren y sea ms seguro su nacimiento.  CAMBIOS QUE OCURREN EN EL TERCER TRIMESTRE DEL EMBARAZO  Su organismo atravesar numerosos cambios durante el embarazo. Estos pueden variar de una persona a otra. Converse con el profesional que la asiste acerca los cambios que   usted note y que la preocupen.   Durante el ltimo trimestre probablemente sienta un aumento del apetito. Es normal tener "antojos" de ciertas comidas. Esto vara de una persona a otra y de un embarazo a otro.  Podrn aparecer las primeras estras en las caderas, abdomen y mamas. Estos son cambios normales del cuerpo durante el embarazo. No existen  medicamentos ni ejercicios que puedan prevenir estos cambios.  La constipacin puede tratarse con un laxante o agregando fibra a su dieta. Beber grandes cantidades de lquidos, tomar fibras en forma de vegetales, frutas y granos integrales es de gran ayuda.  Tambin es beneficioso practicar actividad fsica. Si ha sido una persona activa hasta el embarazo, podr continuar con la mayora de las actividades durante el mismo. Si ha sido menos activa, puede ser beneficioso que comience con un programa de ejercicios, como realizar caminatas. Consulte con el profesional que la asiste antes de comenzar un programa de ejercicios.  Evite el consumo de cigarrillos, el alcohol, los medicamentos no recetados y las "drogas de la calle" durante el embarazo. Estas sustancias qumicas afectan la formacin y el desarrollo del beb. Evite estas sustancias durante todo el embarazo para asegurar el nacimiento de un beb sano.  Podr sentir dolor de espalda, tener vrices en las venas y hemorroides, o si ya los sufra, pueden empeorar.  Durante el tercer trimestre se cansar con ms facilidad, lo cual es normal.  Los movimientos del beb pueden ser ms fuertes y con ms frecuencia.  Puede que note dificultades para respirar normalmente.  El ombligo puede salir hacia afuera.  A veces sale una secrecin amarilla de las mamas, que se llama calostro.  Podr aparecer una secrecin mucosa con sangre. Esto suele ocurrir entre unos pocos das y una semana antes del parto. INSTRUCCIONES PARA EL CUIDADO EN EL HOGAR   Cumpla con las citas de control. Siga las indicaciones del mdico con respecto al uso de medicamentos, los ejercicios y la dieta.  Durante el embarazo debe obtener nutrientes para usted y para su beb. Consuma alimentos balanceados a intervalos regulares. Elija alimentos como carne, pescado, leche y otros productos lcteos descremados, vegetales, frutas, panes integrales y cereales. El mdico le informar  cul es el aumento de peso ideal.  Las relaciones sexuales pueden continuarse hasta casi el final del embarazo, si no se presentan otros problemas como prdida prematura (antes de tiempo) de lquido amnitico, hemorragia vaginal o dolor en el vientre (abdominal).  Realice actividad fsica todos los das, si no tiene restricciones. Consulte con el profesional que la asiste si no sabe con certeza si determinados ejercicios son seguros. El mayor aumento de peso se producir en los ltimos 2 trimestres del embarazo. El ejercicio ayuda a:  Controlar su peso.  Mantenerse en forma para el trabajo de parto y el parto .  Perder peso despus del parto.  Haga reposo con frecuencia, con las piernas elevadas, o segn lo necesite para evitar los calambres y el dolor de cintura.  Use un buen sostn o como los que se usan para hacer deportes para aliviar la sensibilidad de las mamas. Tambin puede serle til si lo usa mientras duerme. Si pierde calostro, podr utilizar apsitos en el sostn.  No utilice la baera con agua caliente, baos turcos y saunas.   Colquese el cinturn de seguridad cuando conduzca. Este la proteger a usted y al beb en caso de accidente.  Evite comer carne cruda y el contacto con los utensilios y desperdicios de los gatos. Estos   elementos contienen grmenes que pueden causar defectos de nacimiento en el beb.  Es fcil perder algo de orina durante el embarazo. Apretar y fortalecer los msculos de la pelvis la ayudar con este problema. Practique detener la miccin cuando est en el bao. Estos son los mismos msculos que necesita fortalecer. Son tambin los mismos msculos que utiliza cuando trata de evitar despedir gases. Puede practicar apretando estos msculos diez veces, y repetir esto tres veces por da aproximadamente. Una vez que conozca qu msculos debe apretar, no realice estos ejercicios durante la miccin. Puede favorecerle una infeccin si la orina vuelve hacia  atrs.  Pida ayuda si tienen necesidades financieras, teraputicas o nutricionales. El profesional podr ayudarla con respecto a estas necesidades, o derivarla a otros especialistas.  Haga una lista de nmeros telefnicos de emergencia y tngalos disponibles.  Planifique como obtener ayuda de familiares o amigos cuando regrese a casa desde el hospital.  Hacer un ensayo sobre la partida al hospital.  Tome clases prenatales con el padre para entender, practicar y hacer preguntas sobre el trabajo de parto y el alumbramiento.  Preparar la habitacin del beb / busque una guardera.  No viaje fuera de la ciudad a menos que sea absolutamente necesario y con el asesoramiento de su mdico.  Use slo zapatos de tacn bajo o sin tacn para tener mejor equilibrio y evitar cadas. USO DE MEDICAMENTOS Y CONSUMO DE DROGAS DURANTE EL EMBARAZO   Tome las vitaminas apropiadas para esta etapa tal como se le indic. Las vitaminas deben contener un miligramo de cido flico. Guarde todas las vitaminas fuera del alcance de los nios. La ingestin de slo un par de vitaminas o tabletas que contengan hierro pueden ocasionar la muerte en un beb o en un nio pequeo.  Evite el uso de todos los medicamentos, incluyendo hierbas, medicamentos de venta libre, sin receta o que no hayan sido sugeridos por su mdico. Slo tome medicamentos de venta libre o medicamentos recetados para el dolor, el malestar o fiebre como lo indique su mdico. No tome aspirina, ibuprofeno o naproxeno excepto que su mdico se lo indique.  Infrmele al profesional si consume alguna droga.  El alcohol se relaciona con ciertos defectos congnitos. Incluye el sndrome de alcoholismo fetal. Debe evitar absolutamente el consumo de alcohol, en cualquier forma. El fumar produce baja tasa de natalidad y bebs prematuros.  Las drogas ilegales o de la calle son muy perjudiciales para el beb. Estn absolutamente prohibidas. Un beb que nace de una  madre adicta, ser adicto al nacer. Ese beb tendr los mismos sntomas de abstinencia que un adulto. SOLICITE ATENCIN MDICA SI:  Tiene preguntas o preocupaciones relacionadas con el embarazo. Es mejor que llame para formular las preguntas si no puede esperar hasta la prxima visita, que sentirse preocupada por ellas.  SOLICITE ATENCIN MDICA DE INMEDIATO SI:   La temperatura oral le sube a ms de 38,9 C (102 F) o lo que su mdico le indique.  Tiene una prdida de lquido por la vagina (canal de parto). Si sospecha una ruptura de las membranas, tmese la temperatura y llame al profesional para informarlo sobre esto.  Observa unas pequeas manchas, una hemorragia vaginal o elimina cogulos. Notifique al profesional acerca de la cantidad y de cuntos apsitos est utilizando.  Presenta un olor desagradable en la secrecin vaginal y observa un cambio en el color, de transparente a blanco.  Ha vomitado durante ms de 24 horas.  Siente escalofros o le sube la fiebre.  Le   falta el aire.  Siente ardor al orinar.  Baja o sube ms de 2 libras (900 g), o segn lo indicado por el profesional que la asiste.  Observa que sbitamente se le hinchan el rostro, las manos, los pies o las piernas.  Siente dolor en el vientre (abdominal). Las molestias en el ligamento redondo son una causa benigna frecuente de dolor abdominal durante el embarazo. El profesional que la asiste deber evaluarla.  Presenta dolor de cabeza intenso que no se alivia.  Tiene problemas visuales, visin doble o borrosa.  Si no siente los movimientos del beb durante ms de 1 hora. Si piensa que el beb no se mueve tanto como lo haca habitualmente, coma algo que contenga azcar y recustese sobre el lado izquierdo durante una hora. El beb debe moverse al menos 4  5 veces por hora. Comunquese inmediatamente si el beb se mueve menos que lo indicado.  Se cae, se ve involucrada en un accidente automovilstico o sufre algn  tipo de traumatismo.  En su hogar hay violencia mental o fsica. Document Released: 10/29/2004 Document Revised: 10/14/2011 [employer] Patient Information 2014 [employer].  

## 2012-06-23 NOTE — Progress Notes (Signed)
Monique Hutchinson is a 37 y.o. female G2P1001 at [redacted]w[redacted]d who presents today for a high-risk OB follow-up.  She complains of right ankle swelling, goes away when walking but when sitting it swells worse. She also has swelling and numbness in hands. She denies any cramping or contractions, denies vaginal discharge or bleeding.  She feels the baby move regularly.  She is tolerating food and drink without nausea or vomiting.

## 2012-06-24 LAB — RPR

## 2012-06-24 NOTE — Progress Notes (Signed)
I saw and examined patient and agree with student note. FH small for dates. Likely constitutional but will get f/u U/S for growth and AFI. No other concerns.

## 2012-06-25 ENCOUNTER — Encounter: Payer: Self-pay | Admitting: Family Medicine

## 2012-06-28 ENCOUNTER — Ambulatory Visit (HOSPITAL_COMMUNITY)
Admission: RE | Admit: 2012-06-28 | Discharge: 2012-06-28 | Disposition: A | Discharge status: Home / Self Care | Payer: Self-pay | Source: Ambulatory Visit | Attending: Family Medicine | Admitting: Family Medicine

## 2012-06-28 DIAGNOSIS — O34219 Maternal care for unspecified type scar from previous cesarean delivery: Secondary | ICD-10-CM | POA: Insufficient documentation

## 2012-06-28 DIAGNOSIS — IMO0002 Reserved for concepts with insufficient information to code with codable children: Secondary | ICD-10-CM

## 2012-06-28 DIAGNOSIS — O09529 Supervision of elderly multigravida, unspecified trimester: Secondary | ICD-10-CM | POA: Insufficient documentation

## 2012-06-28 DIAGNOSIS — O365990 Maternal care for other known or suspected poor fetal growth, unspecified trimester, not applicable or unspecified: Secondary | ICD-10-CM | POA: Insufficient documentation

## 2012-07-07 ENCOUNTER — Ambulatory Visit (INDEPENDENT_AMBULATORY_CARE_PROVIDER_SITE_OTHER): Payer: Self-pay | Admitting: Obstetrics & Gynecology

## 2012-07-07 VITALS — BP 129/83 | Temp 98.0°F | Wt 134.4 lb

## 2012-07-07 DIAGNOSIS — O0990 Supervision of high risk pregnancy, unspecified, unspecified trimester: Secondary | ICD-10-CM

## 2012-07-07 DIAGNOSIS — O34219 Maternal care for unspecified type scar from previous cesarean delivery: Secondary | ICD-10-CM

## 2012-07-07 LAB — POCT URINALYSIS DIP (DEVICE)
Bilirubin Urine: NEGATIVE
Ketones, ur: NEGATIVE mg/dL
Protein, ur: NEGATIVE mg/dL
Specific Gravity, Urine: 1.025 (ref 1.005–1.030)
pH: 7 (ref 5.0–8.0)

## 2012-07-07 NOTE — Progress Notes (Signed)
Discussed VBAC vs. TOLAC, given information, will sign consent next visit  C/O hand and arm pain c/w possible CTS.  Can try hand braces

## 2012-07-07 NOTE — Patient Instructions (Addendum)
Embarazo  Systems analyst trimestre  (Pregnancy - Third Trimester) El tercer trimestre del Psychiatrist (los ltimos 3 meses) es el perodo en el cual tanto usted como su beb crecen con ms rapidez. El beb alcanza un largo de aproximadamente 50 cm. y pesa entre 2,700 y 4,500 kg. El beb gana ms tejido graso y est listo para la vida fuera del cuerpo de la [place]. Mientras estn en el interior, los bebs tienen perodos de sueo y vigilia, Warehouse manager y tienen hipo. Quizs sienta pequeas contracciones del tero. Este es el falso trabajo de [place]. Tambin se las conoce como contracciones de [place] . Es como una prctica del parto. Los problemas ms habituales de esta etapa del embarazo incluyen mayor dificultad para respirar, hinchazn de las manos y los pies por retencin de lquidos y la necesidad de Geographical information systems officer con ms frecuencia debido a que el tero y el beb presionan sobre la vejiga.  EXAMENES PRENATALES   Durante los Manpower Inc, deber seguir realizndose anlisis de [place]. Estas pruebas se realizan para controlar su salud y la del beb. Los AR[employer]ARK Corporation de sangre se Radiographer, therapeutic para [employer] niveles de algunos compuestos de la sangre (hemoglobina). La anemia (bajo nivel de hemoglobina) es frecuente durante el embarazo. Para prevenirla, se administran hierro y vitaminas. Tambin le tomarn nuevas anlisis para descartar diabetes. Podrn repetirle algunas de las Hovnan[employer] Enterprises hicieron previamente.  En cada visita le medirn el tamao del tero. Esto permite asegurar que el beb se desarrolla adecuadamente, segn la fecha del embarazo.  Le controlarn la presin arterial en cada visita prenatal. Esto es para asegurarse de que no sufre toxemia.  Le harn un anlisis de orina en cada visita prenatal, para descartar infecciones, diabetes y la presencia de protenas.  Tambin en cada visita controlarn su peso. Esto se realiza para asegurarse que aumenta de peso al ritmo indicado y que usted y su  beb evolucionan normalmente.  En algunas ocasiones se realiza una prueba de ultrasonido para confirmar el correcto desarrollo y evolucin del beb. Esta prueba se realiza con ondas sonoras inofensivas para el beb, de modo que el profesional pueda calcular ms precisamente la fecha del [place].  Analice con su mdico los analgsicos y la anestesia que recibir durante el [place] de parto y [place].  Comente la posibilidad de que necesite una cesrea y qu anestesia se recibir.  Informe a su mdico si sufre violencia familiar mental o fsica. A veces, se indica la prueba especializada sin estrs, la prueba de tolerancia a las contracciones y el perfil biofsico para asegurarse de que el beb no tiene problemas. El estudio del lquido amnitico que rodea al beb se llama amniocentesis. El lquido amnitico se obtiene introduciendo una aguja en el vientre (abdomen ). En ocasiones se lleva a cabo cerca del final del embarazo, si es necesario inducir a un parto. En este caso se realiza para asegurarse que los pulmones del beb estn lo suficientemente maduros como para que pueda vivir fuera del tero. Si los pulmones no han madurado y es peligroso que el beb nazca, se Building services engineer a la madre una inyeccin de [place] , 1 a 2 [address]  [address] antes del [address]. Vivia Budge ayuda a que los pulmones del beb maduren y sea ms seguro su nacimiento.  CAMBIOS QUE OCURREN EN EL TERCER TRIMESTRE DEL EMBARAZO  Su organismo atravesar numerosos cambios durante el [place]. Estos pueden variar de Neomia Dear persona a otra. Converse con el profesional que la asiste acerca los cambios que  usted note y que la preocupen.   Durante el ltimo trimestre probablemente sienta un aumento del apetito. Es normal tener "antojos" de Development worker, community. Esto vara de Neomia Dear persona a otra y de un embarazo a Therapist, art.  Podrn aparecer las primeras estras en las caderas, abdomen y [place]. Estos son cambios normales del cuerpo durante el [place]. No existen  medicamentos ni ejercicios que puedan prevenir [employer]Max.  La constipacin puede tratarse con un laxante o agregando fibra a su dieta. Beber grandes cantidades de lquidos, tomar fibras en forma de vegetales, frutas y granos integrales es de gran [place].  Tambin es beneficioso practicar actividad fsica. Si ha sido una persona Engineer, mining, podr continuar con la Harley[employer]son de las actividades durante el mismo. Si ha sido [employer], puede ser beneficioso que comience con un programa de ejercicios, Museum/gallery exhibitions officer. Consulte con el profesional que la asiste antes de comenzar un programa de ejercicios.  Evite el consumo de cigarrillos, el alcohol, los medicamentos no recetados y las "drogas de la calle" durante el Psychiatrist. Estas sustancias qumicas afectan la formacin y el desarrollo del beb. Evite estas sustancias durante todo el embarazo para asegurar el nacimiento de un beb sano.  Podr sentir dolor de espalda, tener vrices en las venas y hemorroides, o si ya los sufra, pueden [place].  Durante el tercer trimestre se cansar con ms facilidad, lo cual es normal.  Los movimientos del beb pueden ser ms fuertes y con ms frecuencia.  Puede que note dificultades para respirar normalmente.  El ombligo puede salir hacia afuera.  A veces sale Veterinary surgeon de las [place], que se llama Product manager.  Podr aparecer Neomia Dear secrecin mucosa con sangre. Esto suele ocurrir General Electric unos [address] y Neomia Dear semana antes del [place]. INSTRUCCIONES PARA EL CUIDADO EN EL HOGAR   Cumpla con las citas de control. Siga las indicaciones del mdico con respecto al uso de [place], los ejercicios y la dieta.  Durante el embarazo debe obtener nutrientes para usted y para su beb. Consuma alimentos balanceados a intervalos regulares. Elija alimentos como carne, pescado, [place] y otros productos lcteos descremados, vegetales, frutas, panes integrales y cereales. El Office Depot informar  cul es el aumento de peso ideal.  Las relaciones sexuales pueden continuarse hasta casi el final del embarazo, si no se presentan otros problemas como prdida prematura (antes de [place]) de lquido amnitico, hemorragia vaginal o dolor en el vientre (abdominal).  Realice Tesoro Corporation, si no tiene restricciones. Consulte con el profesional que la asiste si no sabe con certeza si determinados ejercicios son seguros. El mayor aumento de peso se producir en los ltimos 2 trimestres del Psychiatrist. El ejercicio ayuda a:  Engineering geologist.  Mantenerse en forma para el trabajo de parto y [place] .  Perder peso despus del parto.  Haga reposo con frecuencia, con las piernas elevadas, o segn lo necesite para evitar los calambres y el dolor de cintura.  Use un buen sostn o como los que se usan para hacer deportes para Paramedic la sensibilidad de las [place]. Tambin puede serle til si lo [place] mientras duerme. Si pierde Product manager, podr Parker Hann[employer]in.  No utilice la baera con agua caliente, baos turcos y saunas.   Colquese el cinturn de seguridad cuando conduzca. Este la proteger a usted y al beb en caso de accidente.  Evite comer carne cruda y el contacto con los utensilios y desperdicios de los gatos. Estos  elementos contienen grmenes que pueden causar defectos de nacimiento en el beb.  Es fcil perder algo de orina durante el [employer]. Apretar y Chief Operating Officer los msculos de la pelvis la ayudar con este problema. Practique detener la miccin cuando est en el bao. Estos son los mismos msculos que Development worker, international aid. Son [employer]PP[employer] Partners mismos msculos que utiliza cuando trata de evitar despedir gases. Puede practicar apretando estos msculos WellPoint, y repetir esto tres veces por da aproximadamente. Una vez que conozca qu msculos debe apretar, no realice estos ejercicios durante la miccin. Puede favorecerle una infeccin si la orina vuelve hacia  atrs.  Pida ayuda si tienen necesidades financieras, teraputicas o nutricionales. El profesional podr ayudarla con respecto a estas necesidades, o derivarla a otros especialistas.  Haga una lista de nmeros telefnicos de emergencia y tngalos disponibles.  Planifique como obtener ayuda de familiares o amigos cuando regrese a Programmer, applications hospital.  Hacer un ensayo sobre la partida al hospital.  [place] clases prenatales con el padre para entender, practicar y hacer preguntas sobre el Jolane Bankhead de parto y el alumbramiento.  Preparar la habitacin del beb / busque Fatima Blank.  No viaje fuera de la ciudad a menos que sea absolutamente necesario y con el asesoramiento de su mdico.  Use slo zapatos de tacn bajo o sin tacn para tener mejor equilibrio y Automotive engineer cadas. USO DE MEDICAMENTOS Y CONSUMO DE DROGAS DURANTE EL [facility]   Tome las vitaminas apropiadas para esta etapa tal como se le indic. Las vitaminas deben contener un miligramo de cido flico. Guarde todas las vitaminas fuera del alcance de los nios. La ingestin de slo un par de vitaminas o tabletas que contengan hierro pueden ocasionar la New[employer] Mining en un beb o en un nio pequeo.  Evite el uso de The Mutual of Omaha, incluyendo hierbas, medicamentos de [place], sin receta o que no hayan sido sugeridos por su mdico. Slo tome medicamentos de venta libre o medicamentos recetados para Chief Technology Officer, Environmental health practitioner o fiebre como lo indique su mdico. No tome aspirina, ibuprofeno o naproxeno excepto que su mdico se lo indique.  Infrmele al profesional si consume alguna droga.  El alcohol se relaciona con ciertos defectos congnitos. Incluye el sndrome de alcoholismo fetal. Debe evitar absolutamente el consumo de alcohol, en cualquier forma. El fumar produce baja tasa de natalidad y bebs prematuros.  Las drogas ilegales o de la calle son muy perjudiciales para el beb. Estn absolutamente prohibidas. Un beb que nace de [employer] Energy, ser adicto al nacer. Ese beb tendr los mismos sntomas de abstinencia que un adulto. SOLICITE ATENCIN MDICA SI:  Tiene preguntas o preocupaciones relacionadas con el embarazo. Es mejor que llame para formular las preguntas si no puede esperar hasta la prxima visita, que sentirse preocupada por ellas.  SOLICITE ATENCIN MDICA DE INMEDIATO SI:   La temperatura oral le sube a ms de 38,9 C (102 F) o lo que su mdico le indique.  Tiene una prdida de lquido por la vagina (canal de parto). Si sospecha una ruptura de las [place], tmese la temperatura y llame al profesional para informarlo sobre esto.  Observa unas pequeas manchas, una hemorragia vaginal o elimina cogulos. Notifique al profesional acerca de la cantidad y de cuntos apsitos est utilizando.  Presenta un olor desagradable en la secrecin vaginal y observa un cambio en el color, de transparente a blanco.  Ha vomitado durante ms de 24 horas.  Siente escalofros o le sube la fiebre.  Conley Rolls  falta el aire.  Siente ardor al Beatrix Shipper.  [place]aja o sube ms de 2 libras (900 g), o segn lo indicado por el profesional que la asiste.  Observa que sbitamente se le hinchan el rostro, las manos, los pies o las piernas.  Siente dolor en el vientre (abdominal). Las Federal[employer]Mogul en el ligamento redondo son Neomia Dear causa benigna frecuente de dolor abdominal durante el embarazo. El profesional que la asiste deber evaluarla.  Presenta dolor de cabeza intenso que no se [place].  Tiene problemas visuales, visin doble o borrosa.  Si no siente los movimientos del beb durante ms de 1 hora. Si piensa que el beb no se mueve tanto como lo haca habitualmente, coma algo que Psychologist, clinical y Target Corporation lado izquierdo durante [place]. El beb debe moverse al menos 4  5 veces por hora. Comunquese inmediatamente si el beb se mueve menos que lo indicado.  Se cae, se ve involucrada en un accidente automovilstico o sufre algn  tipo de traumatismo.  En su hogar hay violencia mental o fsica. Document Released: 10/29/2004 Document Revised: 10/14/2011 [facility] Patient Information 2014 [place], [place]. Parto vaginal luego de una cesrea (Vaginal Birth After Cesarean Delivery) Un parto vaginal luego de un parto por cesrea es dar a luz por la vagina luego de haber dado a luz por medio de una intervencin [place]. En el pasado, si una mujer tena un beb por cesrea, todos los partos posteriores deban hacerse por cesrea. Esto ya no es as. Puede ser seguro para la mam intentar un parto vaginal luego de una cesrea. La decisin final de tener un parto vaginal o por cesrea debe tomarse en conjunto, entre la paciente y el mdico. [place] riesgos y los beneficios debern evaluarse con relacin a los motivos y al tipo de cesrea previa LAS MUJERES QUE QUIEREN TENER UN PARTO VAGINAL, DEBEN CONSULTAR CON SU MDICO PARA ASEGURARSE QUE:  La cesrea anterior se realiz con una incisin uterina transversal (no con una incisin vertical clsica).  El canal de parto es lo suficientemente grande como para que pase el [place].  No ha sido sometida a otras operaciones del tero.  Durante el trabajo de parto le realizarn un monitoreo electrnico fetal, en todo momento.  Es necesario que haya un quirfano disponible y listo en caso de necesitar una cesrea de emergencia.  Un cirujano y personal de quirfano estarn disponibles en todo momento durante el [place] de parto, para realizar una cesrea en caso de ser necesario.  Habr un anestesista disponible en caso de necesitar una cesrea de emergencia.  La nursery est lista cuenta con personal especializado y el equipo disponible para cuidar al beb en caso de emergencia. BENEFICIOS  Permanencia ms breve en el hospital.  Menores costos en el parto, la nurse y el hospital.  Menos prdida de sangre y menos probabilidad de necesitar una transfusin sangunea.  Menos probabilidad  de tener fiebre o molestias como consecuencia de una ciruga mayor  Menos riesgo de cogulos sanguneos.  Menos riesgo de sufrir infecciones.  Recuperacin ms rpida luego del alta mdica.  Menos riesgo de complicaciones quirrgicas, como apertura o hernia de la incisin.  Disminucin del riesgo de lesiones a otros rganos  Menor riesgo de remocin del tero (histerectoma)  Menor riesgo de que la placenta cubra parcial o completamente la abertura del tero (placenta previa) en embarazos futuros  Posibilidad de tener una familia grande, si lo desea. RIESGOS  Ruptura del tero.  Si el tero se rompe le  realizarn una histerectoma.  Todas las complicaciones de [place] mayor y lesiones en otros rganos.  Hemorragia excesiva, cogulos e infeccin.  [place] Puntuacin Apgar baja (mtodo que evala al recin nacido segn su apariencia, pulso, muecas, actividad y respiracion) y ms riesgos para el beb.  Hay mas riesgo de ruptura del tero si se induce o aumenta el trabajo de [place].  Hay un mayor riesgo de ruptura uterina si se usan medicamentos para madurar el cuello. NO DEBE LLEVARSE A CABO SI:  La cesrea previa se realiz con una incicin vertical (clsica) o con forma de T, o usted no sabe cul de Luc[employer] han practicado.  Ha sufrido ruptura del tero.  Le han practicado una ciruga de tero.  Tiene problemas mdicos u obsttricos.  El beb est en problemas.  Tuvo dos cesreas previas y ningn parto vaginal. OTRAS COSAS QUE DEBE SABER:  La anestesia peridural es segura.  Es seguro dar vuelta al beb si se encuentra de nalgas (intentar una versin ceflica externa).  Es seguro intentarlo en caso de mellizos.  Los embarazos de ms de 40 semanas no tienen xito con este procedimiento.  Hay un aumento de fracasos en embarazadas obesas.  Hay un aumento del porcentaje de fracasos si el beb pesa 4 Kg o ms.  Hay aumento en el porcentaje de fracasos si el  intervalo entre la operacin cesrea y el parto vaginal es de menos de 19 meses.  Hay un aumento en el porcentaje de fracasos si ha sufrido preeclampsia hipertensin arterial, protenas en la orina e hinchazn del rostro y las extremidades.  El parto vaginal ser muy exitoso si tuvo un parto vaginal previo.  Tambin es [employer] caso que el [place] de parto comience espontneamente antes de la fecha.  El parto vaginal luego de Neomia Dear cesrea es similar a un parto espontneo vaginal normal. Es importante que converse con su mdico desde comienzos del Psychiatrist de modo que pueda Google, beneficios y opciones. De este modo tendr tiempo de decidir que es lo mejor en su caso particular en relacin a su parto por cesrea anterior. Hay que tener en cuenta que puede haber cambios en la madre durante el [place], lo que hace necesario cambiar su decisin o la del mdico. Los consejos, preocupaciones y decisiones debern documentarse en la historia clnica y debe ser firmada por todas las partes. Document Released: 07/08/2007 Document Revised: 04/13/2011 [facility] Patient Information 2014 [place], [place].

## 2012-07-07 NOTE — Progress Notes (Signed)
Pulse- 76  Edema-hands/feet  Pain-cramps, lower abd

## 2012-07-21 ENCOUNTER — Encounter: Payer: Self-pay | Admitting: Obstetrics & Gynecology

## 2012-07-21 ENCOUNTER — Ambulatory Visit (INDEPENDENT_AMBULATORY_CARE_PROVIDER_SITE_OTHER): Payer: Self-pay | Admitting: Obstetrics & Gynecology

## 2012-07-21 VITALS — BP 135/82 | Temp 97.5°F | Wt 137.1 lb

## 2012-07-21 DIAGNOSIS — O0990 Supervision of high risk pregnancy, unspecified, unspecified trimester: Secondary | ICD-10-CM

## 2012-07-21 DIAGNOSIS — O09529 Supervision of elderly multigravida, unspecified trimester: Secondary | ICD-10-CM

## 2012-07-21 DIAGNOSIS — O09523 Supervision of elderly multigravida, third trimester: Secondary | ICD-10-CM

## 2012-07-21 LAB — POCT URINALYSIS DIP (DEVICE)
Hgb urine dipstick: NEGATIVE
Ketones, ur: NEGATIVE mg/dL
Protein, ur: 30 mg/dL — AB
Specific Gravity, Urine: 1.025 (ref 1.005–1.030)
Urobilinogen, UA: 0.2 mg/dL (ref 0.0–1.0)

## 2012-07-21 NOTE — Progress Notes (Signed)
Pulse: 72

## 2012-07-21 NOTE — Progress Notes (Signed)
VBAC consent signed. 

## 2012-07-21 NOTE — Patient Instructions (Signed)
Eleccin del mtodo anticonceptivo  (Contraception Choices) La anticoncepcin (control de la natalidad) es el uso de cualquier mtodo o dispositivo para Location manager. A continuacin se indican algunos de esos mtodos.  MTODOS HORMONALES   Implante anticonceptivo. Es un tubo plstico delgado que contiene la hormona progesterona. No contiene estrgenos. El mdico inserta el tubo en la parte interna del brazo. El tubo puede Geneticist, molecular durante 3 aos. Despus de los 3 aos debe retirarse. El implante impide que los ovarios liberen vulos (ovulacin), espesa el moco cervical, lo que evita que los espermatozoides ingresen al tero y hace ms delgada la membrana que cubre el interior del tero.  Inyecciones de progesterona sola. Estas inyecciones se administran cada 3 meses para evitar el embarazo. La progesterona sinttica impide que los ovarios liberen vulos. Tambin hace que el moco cervical se espese y modifica el recubrimiento interno del tero. Esto hace ms difcil que los espermatozoides sobrevivan en el tero.  Pldoras anticonceptivas. Las pldoras anticonceptivas contienen estrgenos y Education officer, museum. Actan impidiendo que el vulo se forme en el ovario(ovulacin). Las pldoras anticonceptivas son recetadas por el mdico.Tambin se utilizan para tratar los perodos menstruales abundantes.  Minipldora. Este tipo de pldora anticonceptiva contiene slo hormona progesterona. Deben tomarse todos los [address]  [address] del mes y debe recetarlas el mdico.  Parches anticonceptivos. El parche contiene hormonas similares a las que contienen las pldoras anticonceptivas. Deben cambiarse una vez por semana y se utilizan bajo prescripcin mdica.  Anillo vaginal. Anillo vaginal contiene hormonas similares a las que contienen las pldoras anticonceptivas. Se deja colocado durante tres semanas, se lo retira durante 1 semana y luego se coloca uno nuevo. La paciente debe sentirse cmoda para insertar y  retirar el anillo de la vagina.Es necesaria la receta del mdico.  Anticonceptivos de Associate Professor. Los anticonceptivos de emergencia son mtodos para evitar un embarazo despus de una relacin sexual sin proteccin. Esta pldora puede tomarse inmediatamente despus de Child psychotherapist sexuales o hasta 5 [place] de haber tenido sexo sin proteccin. Es ms efectiva si se toma poco tiempo despus. Los anticonceptivos de emergencia estn disponibles sin prescripcin mdica. Consltelo con su farmacutico. No use los anticonceptivos de emergencia como nico mtodo anticonceptivo. MTODOS DE BARRERA   Condn masculino. Es una vaina delgada (ltex o goma) que se [place] en el pene durante el acto sexual. Deri Fuelling con espermicida para aumentar la efectividad.  Condn femenino. Es una vaina blanda y floja que se adapta suavemente a la vagina antes de las relaciones sexuales.  Diafragma. Es una barrera de ltex redonda y Casimer Bilis que debe ser ajustada por un profesional. Se inserta en la vagina, junto con un gel espermicida. Debe insertarse antes de Management consultant. Debe dejar el diafragma colocado en la vagina durante 6 a 8 horas despus de la relacin sexual.  Capuchn cervical. Es una taza de ltex o plstico, redonda y [place] que cubre el cuello del tero y debe ser ajustada por un mdico. Puede dejarlo colocado en la vagina hasta 48 horas despus de las Clinical research associate.  Esponja. Es una pieza blanda y circular de espuma de poliuretano. Contiene un espermicida. Se inserta en la vagina despus de mojarla y antes de las [employer] St[employer].  Espermicidas. Los espermicidas son qumicos que matan o bloquean el esperma y no lo dejan ingresar al cuello del tero y al tero. Vienen en forma de cremas, geles, supositorios, espuma o comprimidos. No es necesario tener Emergency planning/management officer. Se insertan en la vagina con un aplicador  antes de News Corporation. El proceso debe repetirse cada vez que tiene  relaciones sexuales. ANTICONCEPTIVOS INTRAUTERINOS   Dispositivo intrauterino (DIU). Es un dispositivo en forma de T que se coloca en el tero durante el perodo menstrual, para Location manager. Hay dos tipos:  DIU de cobre. Este tipo de DIU est recubierto con un alambre de cobre y se inserta dentro del tero. El cobre hace que el tero y las trompas de Falopio produzcan un liquido que [employer] Stores espermatozoides. Puede permanecer colocado durante 10 aos.  DIU hormonal. Este tipo de DIU contiene la hormona progestina (progesterona sinttica). La hormona espesa el moco cervical y evita que los espermatozoides ingresen al tero y tambin afina la membrana que cubre el tero para evitar la implantacin del vulo fertilizado. La hormona debilita o destruye los espermatozoides que ingresan al tero. Puede permanecer colocado durante 5 aos. MTODOS ANTICONCEPTIVOS PERMANENTES   Ligadura de trompas en la mujer. La ligadura de trompas en la mujer se realiza sellando, atando u obstruyendo quirrgicamente las trompas de Falopio lo que impide que el vulo descienda hacia el tero.  Esterilizacin masculina. Se realiza atando los conductos por los que pasan los espermatozoides (vasectoma).Esto impide que el esperma ingrese a la vagina durante el acto sexual. Luego del procedimiento, el hombre puede eyacular lquido (semen). MTODOS DE PLANIFICACIN NATURAL   Planificacin familiar natural.  Consiste en no tener relaciones sexuales o usar un mtodo de barrera (condn, [place], capuchn cervical) en los [employer] la mujer podra quedar [place].  Mtodo calendario.  Consiste en el seguimiento de la duracin de cada ciclo menstrual y la identificacin de los perodos frtiles.  Mtodo de Occupational hygienist.  Consiste en evitar las relaciones sexuales durante la ovulacin.  Mtodo sintotrmico. Paramedic las relaciones sexuales en la poca en la que se est ovulando, utilizando un termmetro y  tendiendo en cuenta los sntomas de la ovulacin.  Mtodo post-ovulacin. Consiste en planificar las relaciones sexuales para despus de haber ovulado. Independientemente del tipo o mtodo anticonceptivo que usted elija, es importante que use condones para protegerse contra las enfermedades de transmisin sexual (ETS). Hable con su mdico con respecto a qu mtodo anticonceptivo es el ms apropiado para usted.  Document Released: 01/19/2005 Document Revised: 04/13/2011 [facility] Patient Information 2014 [place], [place].

## 2012-07-21 NOTE — Progress Notes (Signed)
Pt wants to VBAC.  Pt only got to 1.5 cm before c/s.  Pt ws induced for post dates but pt believes her due date was wrong.  To sign VBAC consent today.  Last growth on 06/28/12 at 45%.  No complaints today.  BP borderline but nml

## 2012-08-04 ENCOUNTER — Ambulatory Visit (INDEPENDENT_AMBULATORY_CARE_PROVIDER_SITE_OTHER): Payer: Self-pay | Admitting: Family Medicine

## 2012-08-04 VITALS — BP 145/92 | Temp 97.7°F | Wt 137.1 lb

## 2012-08-04 DIAGNOSIS — IMO0002 Reserved for concepts with insufficient information to code with codable children: Secondary | ICD-10-CM | POA: Insufficient documentation

## 2012-08-04 DIAGNOSIS — O34219 Maternal care for unspecified type scar from previous cesarean delivery: Secondary | ICD-10-CM

## 2012-08-04 DIAGNOSIS — O139 Gestational [pregnancy-induced] hypertension without significant proteinuria, unspecified trimester: Secondary | ICD-10-CM

## 2012-08-04 DIAGNOSIS — O163 Unspecified maternal hypertension, third trimester: Secondary | ICD-10-CM

## 2012-08-04 LAB — CBC
HCT: 35.7 % — ABNORMAL LOW (ref 36.0–46.0)
MCH: 29.6 pg (ref 26.0–34.0)
MCV: 85.2 fL (ref 78.0–100.0)
Platelets: 215 10*3/uL (ref 150–400)
RBC: 4.19 MIL/uL (ref 3.87–5.11)
RDW: 17.5 % — ABNORMAL HIGH (ref 11.5–15.5)

## 2012-08-04 LAB — COMPREHENSIVE METABOLIC PANEL
ALT: 27 U/L (ref 0–35)
BUN: 8 mg/dL (ref 6–23)
CO2: 21 mEq/L (ref 19–32)
Calcium: 8.9 mg/dL (ref 8.4–10.5)
Creat: 0.52 mg/dL (ref 0.50–1.10)
Total Bilirubin: 0.3 mg/dL (ref 0.3–1.2)

## 2012-08-04 LAB — POCT URINALYSIS DIP (DEVICE)
Bilirubin Urine: NEGATIVE
Glucose, UA: NEGATIVE mg/dL
Hgb urine dipstick: NEGATIVE
Nitrite: NEGATIVE
Urobilinogen, UA: 0.2 mg/dL (ref 0.0–1.0)

## 2012-08-04 NOTE — Patient Instructions (Addendum)
Embarazo  Systems analyst trimestre  (Pregnancy - Third Trimester) El tercer trimestre del Psychiatrist (los ltimos 3 meses) es el perodo en el cual tanto usted como su beb crecen con ms rapidez. El beb alcanza un largo de aproximadamente 50 cm. y pesa entre 2,700 y 4,500 kg. El beb gana ms tejido graso y est listo para la vida fuera del cuerpo de la [place]. Mientras estn en el interior, los bebs tienen perodos de sueo y vigilia, Warehouse manager y tienen hipo. Quizs sienta pequeas contracciones del tero. Este es el falso trabajo de [place]. Tambin se las conoce como contracciones de [place] . Es como una prctica del parto. Los problemas ms habituales de esta etapa del embarazo incluyen mayor dificultad para respirar, hinchazn de las manos y los pies por retencin de lquidos y la necesidad de Geographical information systems officer con ms frecuencia debido a que el tero y el beb presionan sobre la vejiga.  EXAMENES PRENATALES   Durante los Manpower Inc, deber seguir realizndose anlisis de [place]. Estas pruebas se realizan para controlar su salud y la del beb. Los ARAMARK Corporation de sangre se Radiographer, therapeutic para [employer] Mutual niveles de algunos compuestos de la sangre (hemoglobina). La anemia (bajo nivel de hemoglobina) es frecuente durante el embarazo. Para prevenirla, se administran hierro y vitaminas. Tambin le tomarn nuevas anlisis para descartar diabetes. Podrn repetirle algunas de las Hovnanian Enterprises hicieron previamente.  En cada visita le medirn el tamao del tero. Esto permite asegurar que el beb se desarrolla adecuadamente, segn la fecha del embarazo.  Le controlarn la presin arterial en cada visita prenatal. Esto es para asegurarse de que no sufre toxemia.  Le harn un anlisis de orina en cada visita prenatal, para descartar infecciones, diabetes y la presencia de protenas.  Tambin en cada visita controlarn su peso. Esto se realiza para asegurarse que aumenta de peso al ritmo indicado y que usted y  su beb evolucionan normalmente.  En algunas ocasiones se realiza una prueba de ultrasonido para confirmar el correcto desarrollo y evolucin del beb. Esta prueba se realiza con ondas sonoras inofensivas para el beb, de modo que el profesional pueda calcular ms precisamente la fecha del [place].  Analice con su mdico los analgsicos y la anestesia que recibir durante el [place] de parto y [place].  Comente la posibilidad de que necesite una cesrea y qu anestesia se recibir.  Informe a su mdico si sufre violencia familiar mental o fsica. A veces, se indica la prueba especializada sin estrs, la prueba de tolerancia a las contracciones y el perfil biofsico para asegurarse de que el beb no tiene problemas. El estudio del lquido amnitico que rodea al beb se llama amniocentesis. El lquido amnitico se obtiene introduciendo una aguja en el vientre (abdomen ). En ocasiones se lleva a cabo cerca del final del embarazo, si es necesario inducir a un parto. En este caso se realiza para asegurarse que los pulmones del beb estn lo suficientemente maduros como para que pueda vivir fuera del tero. Si los pulmones no han madurado y es peligroso que el beb nazca, se Building services engineer a la madre una inyeccin de [place] , 1 a 2 [address]  [address] antes del [address]. Vivia Budge ayuda a que los pulmones del beb maduren y sea ms seguro su nacimiento.  CAMBIOS QUE OCURREN EN EL TERCER TRIMESTRE DEL EMBARAZO  Su organismo atravesar numerosos cambios durante el [place]. Estos pueden variar de Neomia Dear persona a otra. Converse con el profesional que la asiste acerca los cambios que  usted note y que la preocupen.   Durante el ltimo trimestre probablemente sienta un aumento del apetito. Es normal tener "antojos" de Development worker, community. Esto vara de Neomia Dear persona a otra y de un embarazo a Therapist, art.  Podrn aparecer las primeras estras en las caderas, abdomen y [place]. Estos son cambios normales del cuerpo durante el [place]. No existen  medicamentos ni ejercicios que puedan prevenir CarMax.  La constipacin puede tratarse con un laxante o agregando fibra a su dieta. Beber grandes cantidades de lquidos, tomar fibras en forma de vegetales, frutas y granos integrales es de gran [place].  Tambin es beneficioso practicar actividad fsica. Si ha sido una persona Engineer, mining, podr continuar con la Harley-Davidson de las actividades durante el mismo. Si ha sido [employer] Insurance, puede ser beneficioso que comience con un programa de ejercicios, Museum/gallery exhibitions officer. Consulte con el profesional que la asiste antes de comenzar un programa de ejercicios.  Evite el consumo de cigarrillos, el alcohol, los medicamentos no recetados y las "drogas de la calle" durante el Psychiatrist. Estas sustancias qumicas afectan la formacin y el desarrollo del beb. Evite estas sustancias durante todo el embarazo para asegurar el nacimiento de un beb sano.  Podr sentir dolor de espalda, tener vrices en las venas y hemorroides, o si ya los sufra, pueden [place].  Durante el tercer trimestre se cansar con ms facilidad, lo cual es normal.  Los movimientos del beb pueden ser ms fuertes y con ms frecuencia.  Puede que note dificultades para respirar normalmente.  El ombligo puede salir hacia afuera.  A veces sale Veterinary surgeon de las [place], que se llama Product manager.  Podr aparecer Neomia Dear secrecin mucosa con sangre. Esto suele ocurrir General Electric unos [address] y Neomia Dear semana antes del [place]. INSTRUCCIONES PARA EL CUIDADO EN EL HOGAR   Cumpla con las citas de control. Siga las indicaciones del mdico con respecto al uso de [place], los ejercicios y la dieta.  Durante el embarazo debe obtener nutrientes para usted y para su beb. Consuma alimentos balanceados a intervalos regulares. Elija alimentos como carne, pescado, [place] y otros productos lcteos descremados, vegetales, frutas, panes integrales y cereales. El Office Depot informar  cul es el aumento de peso ideal.  Las relaciones sexuales pueden continuarse hasta casi el final del embarazo, si no se presentan otros problemas como prdida prematura (antes de [place]) de lquido amnitico, hemorragia vaginal o dolor en el vientre (abdominal).  Realice Tesoro Corporation, si no tiene restricciones. Consulte con el profesional que la asiste si no sabe con certeza si determinados ejercicios son seguros. El mayor aumento de peso se producir en los ltimos 2 trimestres del Psychiatrist. El ejercicio ayuda a:  Engineering geologist.  Mantenerse en forma para el trabajo de parto y [place] .  Perder peso despus del parto.  Haga reposo con frecuencia, con las piernas elevadas, o segn lo necesite para evitar los calambres y el dolor de cintura.  Use un buen sostn o como los que se usan para hacer deportes para Paramedic la sensibilidad de las [place]. Tambin puede serle til si lo [place] mientras duerme. Si pierde Product manager, podr Parker Hannifin.  No utilice la baera con agua caliente, baos turcos y saunas.   Colquese el cinturn de seguridad cuando conduzca. Este la proteger a usted y al beb en caso de accidente.  Evite comer carne cruda y el contacto con los utensilios y desperdicios de los gatos. Estos  elementos contienen grmenes que pueden causar defectos de nacimiento en el beb.  Es fcil perder algo de orina durante el [place]. Apretar y Chief Operating Officer los msculos de la pelvis la ayudar con este problema. Practique detener la miccin cuando est en el bao. Estos son los mismos msculos que Development worker, international aid. Son TEPPCO Partners mismos msculos que utiliza cuando trata de evitar despedir gases. Puede practicar apretando estos msculos WellPoint, y repetir esto tres veces por da aproximadamente. Una vez que conozca qu msculos debe apretar, no realice estos ejercicios durante la miccin. Puede favorecerle una infeccin si la orina vuelve hacia  atrs.  Pida ayuda si tienen necesidades financieras, teraputicas o nutricionales. El profesional podr ayudarla con respecto a estas necesidades, o derivarla a otros especialistas.  Haga una lista de nmeros telefnicos de emergencia y tngalos disponibles.  Planifique como obtener ayuda de familiares o amigos cuando regrese a Programmer, applications hospital.  Hacer un ensayo sobre la partida al hospital.  [place] clases prenatales con el padre para entender, practicar y hacer preguntas sobre el [place] de parto y el alumbramiento.  Preparar la habitacin del beb / busque Fatima Blank.  No viaje fuera de la ciudad a menos que sea absolutamente necesario y con el asesoramiento de su mdico.  Use slo zapatos de tacn bajo o sin tacn para tener mejor equilibrio y Automotive engineer cadas. USO DE MEDICAMENTOS Y CONSUMO DE DROGAS DURANTE EL [facility]   Tome las vitaminas apropiadas para esta etapa tal como se le indic. Las vitaminas deben contener un miligramo de cido flico. Guarde todas las vitaminas fuera del alcance de los nios. La ingestin de slo un par de vitaminas o tabletas que contengan hierro pueden ocasionar la Newmont Mining en un beb o en un nio pequeo.  Evite el uso de The Mutual of Omaha, incluyendo hierbas, medicamentos de [place], sin receta o que no hayan sido sugeridos por su mdico. Slo tome medicamentos de venta libre o medicamentos recetados para Chief Technology Officer, Environmental health practitioner o fiebre como lo indique su mdico. No tome aspirina, ibuprofeno o naproxeno excepto que su mdico se lo indique.  Infrmele al profesional si consume alguna droga.  El alcohol se relaciona con ciertos defectos congnitos. Incluye el sndrome de alcoholismo fetal. Debe evitar absolutamente el consumo de alcohol, en cualquier forma. El fumar produce baja tasa de natalidad y bebs prematuros.  Las drogas ilegales o de la calle son muy perjudiciales para el beb. Estn absolutamente prohibidas. Un beb que nace de Progress Energy, ser adicto al nacer. Ese beb tendr los mismos sntomas de abstinencia que un adulto. SOLICITE ATENCIN MDICA SI:  Tiene preguntas o preocupaciones relacionadas con el embarazo. Es mejor que llame para formular las preguntas si no puede esperar hasta la prxima visita, que sentirse preocupada por ellas.  SOLICITE ATENCIN MDICA DE INMEDIATO SI:   La temperatura oral le sube a ms de 38,9 C (102 F) o lo que su mdico le indique.  Tiene una prdida de lquido por la vagina (canal de parto). Si sospecha una ruptura de las [place], tmese la temperatura y llame al profesional para informarlo sobre esto.  Observa unas pequeas manchas, una hemorragia vaginal o elimina cogulos. Notifique al profesional acerca de la cantidad y de cuntos apsitos est utilizando.  Presenta un olor desagradable en la secrecin vaginal y observa un cambio en el color, de transparente a blanco.  Ha vomitado durante ms de 24 horas.  Siente escalofros o le sube la fiebre.  Conley Rolls  falta el aire.  Siente ardor al Beatrix Shipper.  Baja o sube ms de 2 libras (900 g), o segn lo indicado por el profesional que la asiste.  Observa que sbitamente se le hinchan el rostro, las manos, los pies o las piernas.  Siente dolor en el vientre (abdominal). Las Federal-Mogul en el ligamento redondo son Neomia Dear causa benigna frecuente de dolor abdominal durante el embarazo. El profesional que la asiste deber evaluarla.  Presenta dolor de cabeza intenso que no se [place].  Tiene problemas visuales, visin doble o borrosa.  Si no siente los movimientos del beb durante ms de 1 hora. Si piensa que el beb no se mueve tanto como lo haca habitualmente, coma algo que Psychologist, clinical y Target Corporation lado izquierdo durante [place]. El beb debe moverse al menos 4  5 veces por hora. Comunquese inmediatamente si el beb se mueve menos que lo indicado.  Se cae, se ve involucrada en un accidente automovilstico o sufre algn  tipo de traumatismo.  En su hogar hay violencia mental o fsica. Document Released: 10/29/2004 Document Revised: 10/14/2011 [facility] Patient Information 2014 [place], [place].  Lactancia materna  (Breastfeeding)  El cambio hormonal durante el Psychiatrist produce el desarrollo del tejido [place] y un aumento en el nmero y tamao de los conductos galactforos. La hormona prolactina permite que las protenas, los azcares y las grasas de la sangre produzcan la WPS Resources materna en las glndulas productoras de [place]. La hormona progesterona impide que la leche materna sea liberada antes del nacimiento del beb. Despus del nacimiento del beb, su nivel de progesterona disminuye permitiendo que la leche materna sea [place]. Pensar en el beb, as como la succin o Theatre manager, pueden estimular la liberacin de [place] de las glndulas productoras de [place].  La decisin de Company secretary) es una de las mejores opciones que usted puede hacer para usted y su beb. La informacin que sigue da una breve resea de los beneficios, as Lexicographer que debe saber sobre la [place].  LOS BENEFICIOS DE AMAMANTAR  Para el beb   La primera leche (calostro) ayuda al mejor funcionamiento del sistema digestivo del beb.   La leche tiene anticuerpos que provienen de la madre y que ayudan a prevenir las infecciones en el beb.   El beb tiene una menor incidencia de asma, alergias y del sndrome de muerte sbita del lactante (SMSL).   Los nutrientes de la [place] materna son mejores para el beb que la [place].  La leche materna mejora el desarrollo cerebral del beb.   Su beb tendr menos gases, clicos y estreimiento.  Es menos probable que el beb desarrolle otras enfermedades, como obesidad infantil, asma o diabetes mellitus. Para usted   La lactancia materna favorece el desarrollo de un vnculo muy especial entre la madre y el beb.   Es ms conveniente,  siempre disponible, a la [place] y [place].   La lactancia materna ayuda a quemar caloras y a perder el peso ganado durante el [place].   Hace que el tero se contraiga ms rpidamente a su tamao normal y Consolidated Edison sangrado despus del [place].   Las [employer].C. Holdings que amamantan tienen menos riesgo de Environmental education officer osteoporosis o cncer de mama o de ovario en el futuro.  FRECUENCIA DEL AMAMANTAMIENTO   Un beb sano, nacido a trmino, puede amamantarse con tanta frecuencia como cada hora, o espaciar las comidas cada tres horas. La frecuencia en la lactancia varan de un beb a  otro.   Los recin nacidos deben ser alimentados por lo menos cada 2-3 horas Administrator y cada 4-5 horas durante la noche. Usted debe amamantarlo un mnimo de 8 tomas en un perodo de 24 horas.  Despierte al beb para amamantarlo si han pasado 3-4 horas desde la ltima comida.  Amamante cuando sienta la necesidad de reducir la plenitud de sus senos o cuando el beb muestre signos de [place]. Las seales de que el beb puede Gentry Fitz son:  Lenora Boys su estado de alerta o vigilancia.  Se estira.  Mueve la cabeza de un lado a otro.  Mueve la cabeza y abre la boca cuando se le toca la mejilla o la boca (reflejo de succin).  Aumenta las vocalizaciones, tales como sonidos de succin, relamerse los labios, arrullos, suspiros, o chirridos.  Mueve la [employer] Group boca.  Se chupa con ganas los dedos o las manos.  Agitacin.  Llanto intermitente.  Los signos de hambre extrema requerirn que lo calme y lo consuele antes de tratar de alimentarlo. Los signos de hambre extrema son:  Agitacin.  Llanto fuerte e intenso.  Gritos.  El amamantamiento frecuente la ayudar a producir ms [place] y a Education officer, community de Engineer, mining en los pezones e hinchazn de las [place].  LACTANCIA MATERNA   Ya sea que se encuentre acostada o sentada, asegrese que el abdomen del beb est enfrente el suyo.   Sostenga  la mama con el pulgar por arriba y los otros 4 dedos por debajo del pezn. Asegrese que sus dedos se encuentren lejos del pezn y de la boca del beb.   Empuje suavemente los labios del beb con el pezn o con el dedo.   Cuando la boca del beb se abra lo suficiente, introduzca el pezn y la zona oscura que lo rodea (areola) tanto como le sea posible dentro de la boca.  Debe haber ms areola visible por arriba del labio superior que por debajo del labio inferior.  La lengua del beb debe estar entre la enca inferior y el seno.  Asegrese de que la boca del beb est en la posicin correcta alrededor del pezn (prendida). Los labios del beb deben crear un sello sobre su pecho.  Las seales de que el beb se ha prendido eficazmente al pezn son:  Payton Doughty o succiona sin dolor.  Se escucha que traga Lyondell Chemical.  No hace ruidos ni chasquidos.  Hay movimientos musculares por arriba y por delante de sus odos al Printmaker.  El beb debe succionar unos 2-3 minutos para que salga la [place]. Permita que el nio se alimente en cada mama todo lo que desee. Alimente al beb hasta que se desprenda o se quede dormido en Freight forwarder y luego ofrzcale el segundo pecho.  Las seales de que el beb est lleno y satisfecho son:  Disminuye gradualmente el nmero de succiones o no succiona.  Se queda dormido.  Extiende o relaja su cuerpo.  Retiene una pequea cantidad de [facility] boca.  Se desprende del pecho por s mismo.  Los signos de una lactancia materna eficaz son:  Los senos han aumentado la firmeza, el peso y el tamao antes de la alimentacin.  Son ms blandos despus de amamantar.  Un aumento del volumen de [place], y tambin el cambio de su consistencia y color se producen hacia el quinto da de Tour manager.  La congestin mamaria se [place] al dar de [place].  Los pezones no duelen,  ni estn agrietados ni sangran.  De ser necesario, interrumpa la succin  poniendo su dedo en la esquina de la boca del beb y deslizando el dedo entre sus encas. A continuacin, retire la mama de su boca.  Es comn que los bebs regurgiten un poco despus de comer.  A menudo los bebs tragan aire al alimentarse. Esto puede hacer que se sienta molesto. Hacer eructar al beb al [place] de pecho puede ser de [place].  Se recomiendan suplementos de vitamina D para los bebs que reciben slo [address]  Evite el uso del chupete durante las primeras 4 a 6 semanas de vida.  Evite la alimentacin suplementaria con agua, frmula o jugo en lugar de la Colgate Pal[employer]ive. La leche materna es todo el alimento que el beb necesita. No es necesario que el nio ingiera agua o preparados de bibern. Sus pechos producirn ms leche si se evita la alimentacin suplementaria durante las primeras semanas. COMO SABER SI EL BEB OBTIENE LA SUFICIENTE LECHE MATERNA  Preguntarse si el beb obtiene la cantidad suficiente de [place] es una preocupacin frecuente Lucent Technologies. Puede asegurarse que el beb tiene la leche suficiente si:   El beb succiona activamente y usted escucha que traga.   El beb parece estar relajado y satisfecho despus de Psychologist, clinical.   El nio se alimenta al menos 8 a 12 veces en 24 horas.  Durante los primeros 3 a 5 das de vida:  Moja 3-5 paales en 24 horas. La materia fecal debe ser blanda y [place].  Tiene al menos 3 a 4 deposiciones en 24 horas. La materia fecal debe ser blanda y [place].  A los 5-7 das de vida, el beb debe tener al menos 3-6 deposiciones en 24 horas. La materia fecal debe ser grumosa y [place] a los 5 [address]  [address] de [place].  Su beb tiene una prdida de Psychologist, counselling a 7al 10% durante los primeros 3 [address]  [address] de [address]  El beb no pierde peso despus de 3-7 [address]  [address] de [address]  El beb debe aumentar 4 a 6 libras (120 a 170 gr.) por semana despus de los 4 [address] de vida.  Aumenta de [place] a los [address] de vida y vuelve al peso del nacimiento dentro de  las 2 semanas. CONGESTIN MAMARIA  Durante la primera semana despus del [place], usted puede experimentar hinchazn en las mamas (congestin [place]). Al estar congestionadas, las mamas se sienten pesadas, calientes o sensibles al tacto. El pico de la congestin ocurre a las 24 -48 horas despus del parto.   La congestin puede disminuirse:  Continuando con la Tour manager.  Aumentando la frecuencia.  Tomando duchas calientes o aplicando calor hmedo en los senos antes de cada comida. Esto aumenta la circulacin y [place] a que la [place].   Masajeando suavemente el pecho antes y [place] [employer]. Con las yemas de los dedos, masajee la pared del pecho hacia el pezn en un movimiento circular.   Asegurarse de que el beb vaca al menos uno de sus pechos en cada alimentacin. Tambin ayuda si comienza la siguiente toma en el otro seno.   Extraiga manualmente o con un sacaleches las mamas para vaciar los pechos si el beb tiene sueo o no se aliment bien. Tambin puede extraer la WPS Resources cuando vuelva a trabajar o si siente que se estn congestionando las [place].  Asegrese de que el beb se prende y est bien colocado durante la Market researcher. Si sigue estas indicaciones, la congestin Geologist, engineering  en 24 a 48 horas. Si an tiene dificultades, consulte a Barista.  CUDESE USTED MISMA  Cuide sus mamas.   Bese o dchese diariamente.   Evite usar Eaton Corporation.   Use un sostn de soporte Evite el uso de sostenes con aro.  Seque al aire sus pezones durante 3-4 minutos despus de cada comida.   Utilice slo apsitos de algodn en el sostn para absorber las prdidas de [place]. La prdida de un poco de Deere & Company las comidas es normal.   Use solamente lanolina pura en sus pezones despus de Museum/gallery exhibitions officer. Usted no tiene que lavarla antes de alimentar al beb. Otra opcin es sacarse unas gotas de [place] y Pepco Holdings pezones.  Continuar con  los autocontroles de la mama. Cudese.   Consuma alimentos saludables. Alterne 3 comidas con 3 colaciones.  Evite los alimentos que usted nota que perjudican al beb.  Dixie Dials, jugos de fruta y agua para Patent examiner su sed (aproximadamente 8 vasos al Futures trader).   Descanse con frecuencia, reljese y tome sus vitaminas prenatales para evitar la fatiga, el estrs y la anemia.  Evite masticar y fumar tabaco.  Evite el consumo de alcohol y drogas.  Tome medicamentos de venta libre y recetados tal como le indic su mdico o Social research officer, government. Siempre debe consultar con su mdico o farmacutico antes de tomar cualquier medicamento, vitamina o suplemento de hierbas.  Sepa que durante la lactancia puede quedar embarazada. Si lo desea, hable con su mdico acerca de la planificacin familiar y los mtodos anticonceptivos seguros que puede utilizar durante la Market researcher. SOLICITE ATENCIN MDICA SI:   Usted siente que quiere dejar de Museum/gallery exhibitions officer o se siente frustrada con la lactancia.  Siente dolor en los senos o en los pezones.  Sus pezones estn agrietados o Water quality scientist.  Sus pechos estn irritados, sensibles o calientes.  Tiene un rea hinchada en cualquiera de los senos.  Siente escalofros o fiebre.  Tiene nuseas o vmitos.  Observa un drenaje en los pezones.  Sus mamas no se llenan antes de Marine scientist al 5to da despus del [place].  Se siente triste y deprimida.  El nio est demasiado somnoliento como para comer.  El nio tiene problemas para Industrial/product designer.   Moja menos de 3 paales en 24 horas.  Mueve el intestino menos de 3 veces en 24 horas.  La piel del beb o la parte blanca de sus ojos est ms amarilla.   El beb no ha aumentado de [place] a los [address] de [place]. ASEGRESE DE QUE:   Comprende estas instrucciones.  Controlar su enfermedad.  Solicitar ayuda de inmediato si no mejora o si empeora. Document Released: 01/19/2005 Document Revised: 10/14/2011 [facility] Patient  Information 2014 [place], [place]. Preeclampsia y eclampsia (Preeclampsia and Eclampsia) La preeclampsia es un trastorno por el cual hay un aumento de la presin arterial durante el embarazo. Ocurre despus de la 20a. semana de gestacin. Si se produce durante la segunda mitad del Psychiatrist, y no hay otros sntomas, se denomina hipertensin gestacional y desaparece luego que el beb nace. Si junto a la hipertensin gestacional se desarrolla alguno de los sntomas que se enumeran ms abajo, el trastorno se denomina preeclampsia. La eclampsia (convulsiones) puede seguir a la preeclampsia. sta es una de las razones por las que deben realizarse controles prenatales. Es muy importante realizar un diagnstico y un tratamiento precoz para la prevencin. CAUSAS No existe una causa conocida para este problema. Existen algunos [employer]  poner a [place] de sufrirlo. Ellos son:  Engineer, agricultural.  Haber sufrido preeclampsia en embarazos anteriores.  Sufrir hipertensin crnica.  Si se trata de un embarazo mltiple (mellizos, trillizos).  Tener 35 aos o ms.  Pertenecer a la Public affairs consultant.  Sufrir problemas renales o ser diabtica.  Sufrir enfermedades como lupus o problemas sanguneos.  Tener sobrepeso (ser Maryjane Hurter). SNTOMAS  Presin arterial elevada.  Dolor de cabeza  Aumento de peso sbito.  Hinchazn de manos, rostro, piernas y pies  Protenas en la orina.  Nuseas y vmitos  Problemas visuales (visin doble o borrosa).  Adormecimiento del rostro, brazos, piernas y pies.  Mareos.  Habla arrastrando las palabras.  La preeclampsia puede causar un retraso en la maduracin del feto.  Separacin (abrupcin) de la placenta.  No hay suficiente lquido en el saco amnitico (oligohidramnios).  Sensibilidad a la luz brillante.  Dolor abdominal. DIAGNSTICO Si se descubren protenas en la orina durante la segunda mitad del [place], esto se  considera preeclampsia. Tambin puede haber otros de los sntomas ya mencionados. TRATAMIENTO Es necesario Pensions consultant.   El profesional que la asiste podr prescribirle reposo en cama en las primeras etapas de la enfermedad. Mucho reposo y restriccin de sal puede ser todo lo que necesite.  Si no responde a los tratamientos ms conservadores, podr ser necesaria la administracin de medicamentos.  En los casos graves, podr ser necesaria la hospitalizacin.  Para tratar la hipertensin.  Para controlar la retencin de lquidos.  Para verificar que el beb no sufra ningn dao.  La hospitalizacin es el mejor modo de tratar los primeros signos de preeclampsia. [employer], la mam y el beb pueden ser observados cuidadosamente y los anlisis de sangre se realizarn de manera ms efectiva y precisa.  Si el trastorno se hace ms grave, podr ser necesario inducir el parto o practicar una cesrea (extraccin del beb por medios quirrgicos). El mejor tratamiento para la preeclampsia/eclampsia es el [place]. La preeclampsia y la eclampsia implican riesgos para la madre y el beb. El profesional lo comentar con usted.. Juntos pueden elaborar la mejor manera de abordar sus problemas.  INSTRUCCIONES PARA EL CUIDADO DOMICILIARIO  Cumpla con las citas y los anlisis prenatales tal como se le indic.  Consulte con el mdico si tiene alguno de [employer].  Descanse y duerma lo suficiente.  Consuma una dieta balanceada, baja en sal, y no agregue sal a la comida.  Evite las situaciones estresantes.  Slo tome medicamentos de venta libre o de prescripcin para Chief Technology Officer, Environmental health practitioner o la fiebre, segn le haya indicado el mdico. SOLICITE ATENCIN MDICA DE INMEDIATO SI:  Observa que se hincha alguna zona del cuerpo, generalmente las piernas.  Aumenta 5 libras (2,3 Kg) o ms en una semana.  Presenta una cefalea intensa, mareos, problemas visuales o confusin.  Tiene dolor en  el abdomen, nuseas o vmitos.  Sufre convulsiones.  Tiene dificultad para mover cualquier parte del cuerpo, siente adormecimiento o tiene dificultad para hablar.  Observa un hematoma o una hemorragia anormal.  Aparece rigidez en el cuello.  Vomita. ASEGRESE QUE:  Comprende estas instrucciones.  Controlar su enfermedad.  Solicitar ayuda inmediatamente si no mejora o si empeora. Document Released: 10/29/2004 Document Revised: 04/13/2011 [facility] Patient Information 2014 [place], [place].

## 2012-08-04 NOTE — Progress Notes (Signed)
BP up today--will check labs and Pr:Cr ratio.  If BP up again or Pr/Cr ratio is abnormal, check U/s for growth and start 2x/wk testing.

## 2012-08-04 NOTE — Progress Notes (Signed)
P=56,

## 2012-08-05 LAB — PROTEIN / CREATININE RATIO, URINE
Creatinine, Urine: 73.8 mg/dL
Protein Creatinine Ratio: 0.56 — ABNORMAL HIGH (ref <0.15)
Total Protein, Urine: 41 mg/dL

## 2012-08-06 ENCOUNTER — Inpatient Hospital Stay (HOSPITAL_COMMUNITY)
Admission: AD | Admit: 2012-08-06 | Discharge: 2012-08-09 | DRG: 774 | Disposition: A | Discharge status: Home / Self Care | Payer: Medicaid Other | Source: Ambulatory Visit | Attending: Obstetrics & Gynecology | Admitting: Obstetrics & Gynecology

## 2012-08-06 ENCOUNTER — Encounter (HOSPITAL_COMMUNITY): Payer: Self-pay | Admitting: *Deleted

## 2012-08-06 ENCOUNTER — Encounter: Payer: Self-pay | Admitting: Family Medicine

## 2012-08-06 DIAGNOSIS — O34219 Maternal care for unspecified type scar from previous cesarean delivery: Secondary | ICD-10-CM

## 2012-08-06 DIAGNOSIS — O09292 Supervision of pregnancy with other poor reproductive or obstetric history, second trimester: Secondary | ICD-10-CM

## 2012-08-06 DIAGNOSIS — O09529 Supervision of elderly multigravida, unspecified trimester: Secondary | ICD-10-CM | POA: Diagnosis present

## 2012-08-06 DIAGNOSIS — O1493 Unspecified pre-eclampsia, third trimester: Secondary | ICD-10-CM

## 2012-08-06 DIAGNOSIS — O0990 Supervision of high risk pregnancy, unspecified, unspecified trimester: Secondary | ICD-10-CM

## 2012-08-06 DIAGNOSIS — O09522 Supervision of elderly multigravida, second trimester: Secondary | ICD-10-CM

## 2012-08-06 DIAGNOSIS — IMO0002 Reserved for concepts with insufficient information to code with codable children: Secondary | ICD-10-CM | POA: Diagnosis present

## 2012-08-06 LAB — URINE MICROSCOPIC-ADD ON

## 2012-08-06 LAB — URINALYSIS, ROUTINE W REFLEX MICROSCOPIC
Bilirubin Urine: NEGATIVE
Glucose, UA: NEGATIVE mg/dL
Ketones, ur: NEGATIVE mg/dL
Protein, ur: 100 mg/dL — AB
Urobilinogen, UA: 0.2 mg/dL (ref 0.0–1.0)

## 2012-08-06 LAB — COMPREHENSIVE METABOLIC PANEL
Albumin: 2.3 g/dL — ABNORMAL LOW (ref 3.5–5.2)
BUN: 11 mg/dL (ref 6–23)
CO2: 21 mEq/L (ref 19–32)
Chloride: 106 mEq/L (ref 96–112)
Creatinine, Ser: 0.49 mg/dL — ABNORMAL LOW (ref 0.50–1.10)
GFR calc Af Amer: >90 mL/min (ref >90)
GFR calc non Af Amer: >90 mL/min (ref >90)
Glucose, Bld: 123 mg/dL — ABNORMAL HIGH (ref 70–99)
Total Bilirubin: 0.4 mg/dL (ref 0.3–1.2)

## 2012-08-06 LAB — CBC
HCT: 34 % — ABNORMAL LOW (ref 36.0–46.0)
Hemoglobin: 11.8 g/dL — ABNORMAL LOW (ref 12.0–15.0)
MCV: 87.2 fL (ref 78.0–100.0)
RDW: 16.2 % — ABNORMAL HIGH (ref 11.5–15.5)
WBC: 11.4 10*3/uL — ABNORMAL HIGH (ref 4.0–10.5)

## 2012-08-06 MED ORDER — LACTATED RINGERS IV BOLUS (SEPSIS)
1000.0000 mL | Freq: Once | INTRAVENOUS | Status: AC
  Administered 2012-08-06: 1000 mL via INTRAVENOUS

## 2012-08-06 MED ORDER — TERBUTALINE SULFATE 1 MG/ML IJ SOLN
0.2500 mg | Freq: Once | INTRAMUSCULAR | Status: AC
  Administered 2012-08-06: 0.25 mg via SUBCUTANEOUS
  Filled 2012-08-06: qty 1

## 2012-08-06 MED ORDER — BUTORPHANOL TARTRATE 1 MG/ML IJ SOLN
1.0000 mg | Freq: Once | INTRAMUSCULAR | Status: AC
  Administered 2012-08-06: 1 mg via INTRAVENOUS
  Filled 2012-08-06: qty 1

## 2012-08-06 MED ORDER — BUTORPHANOL TARTRATE 1 MG/ML IJ SOLN
2.0000 mg | Freq: Once | INTRAMUSCULAR | Status: AC
  Administered 2012-08-06: 1 mg via INTRAVENOUS
  Filled 2012-08-06: qty 2

## 2012-08-06 NOTE — MAU Note (Signed)
Pt reports lower abd pain /cramping since last night , worsened at 3 pm today, denies bleeding or ROM

## 2012-08-06 NOTE — MAU Provider Note (Signed)
History     CSN: [id number]  Arrival date and time: 08/06/12 2058   First Provider Initiated Contact with Patient 08/06/12 2141      Chief Complaint  Patient presents with  . Contractions   HPI Monique Hutchinson is a 37 y.o. G65P1001 female @ [redacted]w[redacted]d by LMP which correlates w/in 1.1wk of 22.5wk u/s, who presents w/ report of uc's that began last night and have progressively increased in frequency and intensity. Denies lof or vb. Reports good fm. Denies ha, scotomata, ruq/epigastric pain, n/v, UTI s/s, abnormal/malodorous d/c, or vulvovaginal itching/irritation.  Initiated pnc at [facility] then transferred to [facility] at 25.1wks d/t h/o SGA and prev c/s. Too late for genetic screening, anatomy u/s normal female, 1hr glucola 114. U/S 4/22 @ 23.6wks efw 35% normal afi, 5/27 @ 28.6wks efw 45% normal afi.  7/3 visit in clinic bp's 140/90s- P:C ratio 0.56, all other labs normal H/O c/s for failed IOL @ 1.5cm, she was originally thought to be postdates by provider, but determined after delivery to be ~37wks w/ SGA baby- desires TOLAC  OB History   Grav Para Term Preterm Abortions TAB SAB Ect Mult Living   2 1 1       1       Past Medical History  Diagnosis Date  . GERD (gastroesophageal reflux disease)   . Poliomyelitis     Past Surgical History  Procedure Laterality Date  . Cesarean section    . Cholecystectomy    . Leg surgery      age 57 due to poliomyelitis    History reviewed. No pertinent family history.  History  Substance Use Topics  . Smoking status: Never Smoker   . Smokeless tobacco: Not on file  . Alcohol Use: No    Allergies: No Known Allergies  Prescriptions prior to admission  Medication Sig Dispense Refill  . calcium carbonate (TUMS - DOSED IN MG ELEMENTAL CALCIUM) 500 MG chewable tablet Chew 1 tablet by mouth daily.      . Prenatal Vit-Fe Fumarate-FA (PRENATAL VITAMINS PLUS) 27-1 MG TABS Take 1 tablet by mouth daily.        Review of Systems  Constitutional:  Negative.   HENT: Negative.   Eyes: Negative.  Negative for blurred vision and double vision.  Respiratory: Negative.   Cardiovascular: Negative.   Gastrointestinal: Positive for abdominal pain (uc's). Negative for nausea and vomiting.  Genitourinary: Negative.   Musculoskeletal: Negative.   Skin: Negative.   Neurological: Negative.  Negative for headaches.  Endo/Heme/Allergies: Negative.   Psychiatric/Behavioral: Negative.    Physical Exam   Blood pressure 138/106, pulse 76, temperature 98.4 F (36.9 C), temperature source Oral, resp. rate 20, height 4' 7.5" (1.41 m), weight 63.504 kg (140 lb), last menstrual period 12/09/2011, SpO2 99.00%.  Physical Exam  Constitutional: She is oriented to person, place, and time. She appears well-developed and well-nourished.  HENT:  Head: Normocephalic.  Neck: Normal range of motion.  Cardiovascular: Normal rate.   Respiratory: Effort normal.  GI: Soft. She exhibits no distension. There is no tenderness.  gravid  Genitourinary:  sve by MAU RN 1.5/50/-3, post  Musculoskeletal: Normal range of motion. She exhibits edema (trace-1+ BLE edema).  Neurological: She is alert and oriented to person, place, and time. She has normal reflexes.  No clonus  Skin: Skin is warm and dry.  Psychiatric: She has a normal mood and affect. Her behavior is normal. Judgment and thought content normal.   FHR: 125, mod variability, 15x15accels, no  decels=Cat I UCs: q 4-7, very uncomfortable MAU Course  Procedures  NST SVE x 2 w/o change LR bolus Terb 0.25mg  [place] x 1, repeated >1hr later w/ another 0.25mg   Stadol 1mg  IV, repeated ~1hr later by another 1mg  IV CBC, CMP, urine P:C ratio  Discussed w/ Dr. Despina Hidden, to admit to antenatal, 24hr urine, monitor bp's  Assessment and Plan  A:  [redacted]w[redacted]d SIUP  G2P1001   AMA  Pre-e  Preterm uc's w/o cervical change  Prev c/s- desires TOLAC  Cat I FHR  P:  Admit to antenatal  24hr urine for total protein and  creatinine clearance  Nubain 10mg  and Phenergan 25mg  IM x 1  NSL  Cont EFM  Call for sbp>=160 and/or dbp >=110    Marge Duncans 08/06/2012, 10:38 PM

## 2012-08-07 ENCOUNTER — Encounter (HOSPITAL_COMMUNITY): Payer: Self-pay | Admitting: *Deleted

## 2012-08-07 DIAGNOSIS — IMO0002 Reserved for concepts with insufficient information to code with codable children: Secondary | ICD-10-CM

## 2012-08-07 DIAGNOSIS — O09529 Supervision of elderly multigravida, unspecified trimester: Secondary | ICD-10-CM

## 2012-08-07 LAB — MRSA PCR SCREENING: MRSA by PCR: NEGATIVE

## 2012-08-07 LAB — PROTEIN / CREATININE RATIO, URINE
Creatinine, Urine: 94.9 mg/dL
Total Protein, Urine: 277.9 mg/dL

## 2012-08-07 MED ORDER — ONDANSETRON HCL 4 MG PO TABS: 4.0000 mg | ORAL_TABLET | ORAL | Status: DC | PRN

## 2012-08-07 MED ORDER — DIBUCAINE 1 % RE OINT: 1.0000 "application " | TOPICAL_OINTMENT | RECTAL | Status: DC | PRN

## 2012-08-07 MED ORDER — BUTORPHANOL TARTRATE 1 MG/ML IJ SOLN
INTRAMUSCULAR | Status: AC
  Administered 2012-08-07: 1 mg
  Filled 2012-08-07: qty 1

## 2012-08-07 MED ORDER — LACTATED RINGERS IV SOLN: 500.0000 mL | INTRAVENOUS | Status: DC | PRN

## 2012-08-07 MED ORDER — OXYCODONE-ACETAMINOPHEN 5-325 MG PO TABS: 1.0000 | ORAL_TABLET | ORAL | Status: DC | PRN

## 2012-08-07 MED ORDER — LIDOCAINE HCL (PF) 1 % IJ SOLN
30.0000 mL | INTRAMUSCULAR | Status: DC | PRN
  Filled 2012-08-07: qty 30

## 2012-08-07 MED ORDER — LACTATED RINGERS IV SOLN
INTRAVENOUS | Status: DC
  Administered 2012-08-07 – 2012-08-08 (×2): via INTRAVENOUS

## 2012-08-07 MED ORDER — MAGNESIUM SULFATE 40 G IN LACTATED RINGERS - SIMPLE
2.0000 g/h | INTRAVENOUS | Status: DC
  Filled 2012-08-07 (×2): qty 500

## 2012-08-07 MED ORDER — BENZOCAINE-MENTHOL 20-0.5 % EX AERO
1.0000 "application " | INHALATION_SPRAY | CUTANEOUS | Status: DC | PRN
  Administered 2012-08-09: 1 via TOPICAL
  Filled 2012-08-07: qty 56

## 2012-08-07 MED ORDER — IBUPROFEN 600 MG PO TABS
600.0000 mg | ORAL_TABLET | Freq: Four times a day (QID) | ORAL | Status: DC
  Administered 2012-08-07 – 2012-08-09 (×8): 600 mg via ORAL
  Filled 2012-08-07: qty 1

## 2012-08-07 MED ORDER — WITCH HAZEL-GLYCERIN EX PADS: 1.0000 "application " | MEDICATED_PAD | CUTANEOUS | Status: DC | PRN

## 2012-08-07 MED ORDER — DIPHENHYDRAMINE HCL 25 MG PO CAPS: 25.0000 mg | ORAL_CAPSULE | Freq: Four times a day (QID) | ORAL | Status: DC | PRN

## 2012-08-07 MED ORDER — OXYTOCIN 40 UNITS IN LACTATED RINGERS INFUSION - SIMPLE MED
62.5000 mL/h | INTRAVENOUS | Status: DC
  Administered 2012-08-07: 999 mL/h via INTRAVENOUS

## 2012-08-07 MED ORDER — PRENATAL MULTIVITAMIN CH: 1.0000 | ORAL_TABLET | Freq: Every day | ORAL | Status: DC

## 2012-08-07 MED ORDER — LACTATED RINGERS IV SOLN: INTRAVENOUS | Status: DC

## 2012-08-07 MED ORDER — TETANUS-DIPHTH-ACELL PERTUSSIS 5-2.5-18.5 LF-MCG/0.5 IM SUSP
0.5000 mL | Freq: Once | INTRAMUSCULAR | Status: AC
  Administered 2012-08-08: 0.5 mL via INTRAMUSCULAR
  Filled 2012-08-07: qty 0.5

## 2012-08-07 MED ORDER — SENNOSIDES-DOCUSATE SODIUM 8.6-50 MG PO TABS
2.0000 | ORAL_TABLET | Freq: Every day | ORAL | Status: DC
  Administered 2012-08-07 – 2012-08-08 (×2): 2 via ORAL

## 2012-08-07 MED ORDER — OXYTOCIN BOLUS FROM INFUSION: 500.0000 mL | INTRAVENOUS | Status: DC

## 2012-08-07 MED ORDER — NALBUPHINE SYRINGE 5 MG/0.5 ML
10.0000 mg | INJECTION | Freq: Once | INTRAMUSCULAR | Status: AC
  Administered 2012-08-07: 10 mg via INTRAMUSCULAR
  Filled 2012-08-07: qty 1

## 2012-08-07 MED ORDER — MAGNESIUM SULFATE BOLUS VIA INFUSION
4.0000 g | Freq: Once | INTRAVENOUS | Status: AC
  Administered 2012-08-07: 4 g via INTRAVENOUS
  Filled 2012-08-07: qty 500

## 2012-08-07 MED ORDER — ZOLPIDEM TARTRATE 5 MG PO TABS: 5.0000 mg | ORAL_TABLET | Freq: Every evening | ORAL | Status: DC | PRN

## 2012-08-07 MED ORDER — LANOLIN HYDROUS EX OINT: TOPICAL_OINTMENT | CUTANEOUS | Status: DC | PRN

## 2012-08-07 MED ORDER — CALCIUM CARBONATE ANTACID 500 MG PO CHEW: 2.0000 | CHEWABLE_TABLET | ORAL | Status: DC | PRN

## 2012-08-07 MED ORDER — ACETAMINOPHEN 325 MG PO TABS: 650.0000 mg | ORAL_TABLET | ORAL | Status: DC | PRN

## 2012-08-07 MED ORDER — IBUPROFEN 600 MG PO TABS
600.0000 mg | ORAL_TABLET | Freq: Four times a day (QID) | ORAL | Status: DC | PRN
  Filled 2012-08-07 (×7): qty 1

## 2012-08-07 MED ORDER — ONDANSETRON HCL 4 MG/2ML IJ SOLN: 4.0000 mg | Freq: Four times a day (QID) | INTRAMUSCULAR | Status: DC | PRN

## 2012-08-07 MED ORDER — BUTORPHANOL TARTRATE 1 MG/ML IJ SOLN: 1.0000 mg | INTRAMUSCULAR | Status: DC | PRN

## 2012-08-07 MED ORDER — SODIUM CHLORIDE 0.9 % IJ SOLN: 3.0000 mL | Freq: Two times a day (BID) | INTRAMUSCULAR | Status: DC

## 2012-08-07 MED ORDER — CITRIC ACID-SODIUM CITRATE 334-500 MG/5ML PO SOLN: 30.0000 mL | ORAL | Status: DC | PRN

## 2012-08-07 MED ORDER — ONDANSETRON HCL 4 MG/2ML IJ SOLN: 4.0000 mg | INTRAMUSCULAR | Status: DC | PRN

## 2012-08-07 MED ORDER — SIMETHICONE 80 MG PO CHEW: 80.0000 mg | CHEWABLE_TABLET | ORAL | Status: DC | PRN

## 2012-08-07 MED ORDER — NALBUPHINE SYRINGE 5 MG/0.5 ML
10.0000 mg | INJECTION | INTRAMUSCULAR | Status: DC | PRN
  Administered 2012-08-07: 10 mg via INTRAVENOUS
  Filled 2012-08-07: qty 1

## 2012-08-07 MED ORDER — DOCUSATE SODIUM 100 MG PO CAPS: 100.0000 mg | ORAL_CAPSULE | Freq: Every day | ORAL | Status: DC

## 2012-08-07 MED ORDER — PRENATAL MULTIVITAMIN CH
1.0000 | ORAL_TABLET | Freq: Every day | ORAL | Status: DC
  Administered 2012-08-08: 1 via ORAL
  Filled 2012-08-07: qty 1

## 2012-08-07 MED ORDER — PROMETHAZINE HCL 25 MG/ML IJ SOLN
25.0000 mg | Freq: Once | INTRAMUSCULAR | Status: AC
  Administered 2012-08-07: 25 mg via INTRAMUSCULAR
  Filled 2012-08-07: qty 1

## 2012-08-07 MED ORDER — BUTORPHANOL TARTRATE 1 MG/ML IJ SOLN
2.0000 mg | Freq: Once | INTRAMUSCULAR | Status: AC
  Administered 2012-08-07: 2 mg via INTRAVENOUS
  Filled 2012-08-07: qty 2

## 2012-08-07 NOTE — Progress Notes (Signed)
Paged and spoke with Shanda Bumps, [place] interpreter.  Asked her to come assist communicating for pt and husband.  Plan to get pt up to bathroom for peri care, change gown & pad, and take her via wheelchair to NICU to see her baby on the way to AICU.  Have spoken with Tia Alert, RN in [facility], to notify her of above.

## 2012-08-07 NOTE — H&P (Signed)
Please refer to MAU provider note. Cheron Every Rodney, [place] 08/07/2012 2:39 AM

## 2012-08-07 NOTE — Progress Notes (Signed)
Patient ID: Monique Hutchinson, female   DOB: 08/27/1975, 37 y.o.   MRN: [id number] Patient came in with pre ter contractions and elevated BP Admitted for pre eclampsia work up  Got a few doses of terb and pain meds to quieten contractions  Cervix now 5 cm Pr/Cr 2.6 so also definitively pre eclamptic  Transfer to LD and start MgSO4 infusion  Monique Hutchinson,Monique Hutchinson 08/07/2012 7:59 AM

## 2012-08-07 NOTE — Progress Notes (Signed)
Monique Hutchinson is a 37 y.o. G2P1001 at [redacted]w[redacted]d by ultrasound admitted for Preterm labor and preeclampsia.  Subjective:   Objective: BP 145/111  Pulse 98  Temp(Src) 98.3 F (36.8 C) (Oral)  Resp 20  Ht 4' 7.5" (1.41 m)  Wt 140 lb (63.504 kg)  BMI 31.94 kg/m2  SpO2 99%  LMP 12/09/2011      FHT:  variable decels mod variability,  UC:   regular, every 2-3 minutes SVE:   Dilation: 5 Effacement (%): 100 Station: -1 Exam by:: lee  Labs: Lab Results  Component Value Date   WBC 11.4* 08/06/2012   HGB 11.8* 08/06/2012   HCT 34.0* 08/06/2012   MCV 87.2 08/06/2012   PLT 208 08/06/2012    Assessment / Plan: Spontaneous labor, progressing normally  Labor: Progressing normally Preeclampsia:  on magnesium sulfate, no signs or symptoms of toxicity, intake and ouput balanced and labs stable Fetal Wellbeing:  Category II Pain Control:  iv pain meds I/D:  n/a Anticipated MOD:  NSVD  Monique Hutchinson 08/07/2012, 9:56 AM

## 2012-08-08 LAB — CBC
HCT: 30.1 % — ABNORMAL LOW (ref 36.0–46.0)
Hemoglobin: 10.2 g/dL — ABNORMAL LOW (ref 12.0–15.0)
MCV: 88.5 fL (ref 78.0–100.0)
RBC: 3.4 MIL/uL — ABNORMAL LOW (ref 3.87–5.11)
RDW: 17.2 % — ABNORMAL HIGH (ref 11.5–15.5)
WBC: 12.4 10*3/uL — ABNORMAL HIGH (ref 4.0–10.5)

## 2012-08-08 NOTE — Progress Notes (Signed)
Assisted Monique Hutchinson with questions and plan of care.

## 2012-08-08 NOTE — Progress Notes (Signed)
Stopped by to check on patient's needs.

## 2012-08-08 NOTE — Lactation Note (Signed)
This note was copied from the chart of Monique Dayna Garcia-Parada. Lactation Consultation Note   Initial consult with this mom of a NICU baby, now 26 hours post partum, and 34 5/[redacted] weeks gestation, and SGA, weighing 3 lbs 4.6 oz. Mom breast fed her 37 year old for 6 months, and also did some pumping, so is familiar with DEP. I spoke to mom in spansh, and explained that she should pump every 3 hours, 8 times a day, and showed mom how to hand express. She was able to express 30 mls of colostrum. I explained that this was a huge amount. I set up  a time with baby's nurse that she breast feed  The baby, Monique Hutchinson, at 2;30 pm today. I will be there to assist mom with latching her baby.  Patient Name: Monique Hutchinson Today's Date: 08/08/2012 Reason for consult: Initial assessment;NICU baby   Maternal Data Formula Feeding for Exclusion: Yes (baby in NICU) Reason for exclusion: Admission to Intensive Care Unit (ICU) post-partum Infant to breast within first hour of birth: No Breastfeeding delayed due to:: Infant status Has patient been taught Hand Expression?: Yes Does the patient have breastfeeding experience prior to this delivery?: Yes  Feeding Feeding Type: Formula Feeding method: Tube/Gavage Length of feed: 20 min  LATCH Score/Interventions                      Lactation Tools Discussed/Used Tools: Pump Breast pump type: Double-Electric Breast Pump WIC Program: Yes (mom knows to call for DEP) Pump Review: Setup, frequency, and cleaning;Milk Storage;Other (comment) (premie setting, hand expresion, teching from NICU booklet on) Initiated by:: bedside rn Date initiated:: 08/07/12   Consult Status Consult Status: Follow-up Date: 08/09/12 Follow-up type: In-patient    Alfred Levins 08/08/2012, 4:47 PM

## 2012-08-08 NOTE — Progress Notes (Signed)
UR chart review completed.  

## 2012-08-08 NOTE — Progress Notes (Signed)
Interpreter, Marly to room to assist RN. Pt assessment done. Plan of care discussed with pt. No questions or concerns at this time. Will continue to monitor pt.

## 2012-08-08 NOTE — Progress Notes (Signed)
Post Partum Day 1 Subjective: up ad lib, voiding, tolerating PO, + flatus and Dizziness with magnesium sulfate, still has headache Baby doing well in NICU  Objective: Blood pressure 153/106, pulse 87, temperature 97.7 F (36.5 C), temperature source Oral, resp. rate 18, height 4' 7.5" (1.41 m), weight 61.326 kg (135 lb 3.2 oz), last menstrual period 12/09/2011, SpO2 100.00%, unknown if currently breastfeeding.  Physical Exam:  General: alert, cooperative, no distress and Interpretor used Lochia: appropriate Uterine Fundus: firm Incision: healing well DVT Evaluation: No evidence of DVT seen on physical exam. 1+ edema DTRs normal, no clonus  I/O last 3 completed shifts: In: 4813.8 [P.O.:660; I.V.:4153.8] Out: 3500 [Urine:3100; Blood:400] Total I/O In: 281.3 [I.V.:281.3] Out: 300 [Urine:300]     Recent Labs  08/06/12 2209 08/08/12 0521  HGB 11.8* 10.2*  HCT 34.0* 30.1*    Assessment/Plan: A:  Postpartum Day #1      Preeclampsia  P:  Transfer to [facility] Unit bed       Discontinue Magnesium Sulfate Plan for discharge tomorrow and Lactation consult Discharge tomorrow only if stable and deemed ready   LOS: 2 days   [facility] 08/08/2012, 9:47 AM

## 2012-08-09 ENCOUNTER — Ambulatory Visit: Payer: Self-pay

## 2012-08-09 NOTE — Discharge Summary (Signed)
Obstetric Discharge Summary Reason for Admission: onset of labor Prenatal Procedures: Preeclampsia Intrapartum Procedures: spontaneous vaginal delivery and magnesium sulfate Postpartum Procedures: none Complications-Operative and Postpartum: none Hemoglobin  Date Value Range Status  08/08/2012 10.2* 12.0 - 15.0 g/dL Final  2/84/1324 40.1   Final     HCT  Date Value Range Status  08/08/2012 30.1* 36.0 - 46.0 % Final  05/18/2012 36   Final   Filed Vitals:   08/08/12 1800 08/08/12 2114 08/09/12 0155 08/09/12 0605  BP: 145/85 122/67 140/65 126/72  Pulse: 87 70 72 78  Temp: 98.3 F (36.8 C) 98.3 F (36.8 C) 98.1 F (36.7 C) 98.1 F (36.7 C)  TempSrc: Oral Oral Oral Oral  Resp: 18 18 18 18   Height:      Weight:    63.05 kg (139 lb)  SpO2:  100% 100% 100%     Physical Exam:  General: alert and no distress Heart: Regular rate and rhythm, normal S1/S2 Lungs: clear to auscultation bilaterally Lochia: appropriate Uterine Fundus: firm Incision: none DVT Evaluation: No evidence of DVT seen on physical exam, 1+ edema in ankles  Discharge Diagnoses: Preelampsia and preterm pregnancy delivered  Discharge Information: Date: 08/09/2012 Activity: pelvic rest Diet: routine Medications: PNV and Ibuprofen Condition: stable Instructions: refer to practice specific booklet Discharge to: home  Follow-up Information   Follow up with [facility]. Schedule an appointment as soon as possible for a visit in 6 weeks. (Call and make appointment)    Contact information:   [address] [place] [address] [phone]       Newborn Data: Live born female  Birth Weight: 3 lb 7.3 oz (1569 g) APGAR: 6, 9  Staying in NICU.  Tawni Carnes 08/09/2012, 7:37 AM  I saw and examined patient and agree with above resident note. I reviewed history, delivery summary, labs and vitals. Napoleon Form, MD

## 2012-08-09 NOTE — Progress Notes (Signed)
teaching with lactation  And interpretor   Complete no questions left to answer   Pt to take motrin for discomfort    Will call if problems   Ambulate out with  Significant other

## 2012-08-09 NOTE — Lactation Note (Signed)
This note was copied from the chart of Monique Hutchinson. Lactation Consultation Note    Follow up consult with this mom of a NICU baby, now 48 hours post partum with a good milk supply already. She is being discharged to home today, and is going to [facility] to get a DEP. She knows to pump every 3 hours, up to 30 minures. Dea ryal, Spanish interpreter there, to help with discharge teaching. i will follow this family in the [facility]   + + + + + + + + + + + + + + + + + + + + + + + Patient Name: Monique Shavy Beachem Today's Date: 08/09/2012 Reason for consult: Follow-up assessment;NICU baby   Maternal Data    Feeding Feeding Type: Formula Feeding method: Tube/Gavage Length of feed: 25 min  LATCH Score/Interventions                      Lactation Tools Discussed/Used WIC Program: Yes (mom to get DEP today)   Consult Status Consult Status: PRN Follow-up type:  (in NICU)    Alfred Levins 08/09/2012, 1:56 PM

## 2012-08-09 NOTE — Progress Notes (Signed)
I assisted FP with explanation of plan of care

## 2012-08-11 ENCOUNTER — Encounter: Payer: Self-pay | Admitting: Obstetrics & Gynecology

## 2012-08-18 ENCOUNTER — Ambulatory Visit: Payer: Self-pay

## 2012-08-18 NOTE — Lactation Note (Signed)
This note was copied from the chart of Girl Ireene Garcia-Parada. Lactation Consultation Note follow up consult with this mom of a now 36 1/[redacted] week gestation baby, weighing 3 -10. Baby is 60 days old. I did teaching on pumping/milk production with Rosey Bath as Spanish interpreter([employer]). Mom encouraged to pump at least 8 times a day, and to go no longer than every 4 hours a t night. She has been going 6-7 hours  At night. i explained she may lose her good supply by doing this, by the time the baby is ready to come home. Mom reports she is using the 30 flanges I gave her, and they are much more comfortable. I also gave her comfort gels, and instructed her in their use. I also assisted mom with latching the baby for the first time. The baby latched and unlatched multiple times in 30 minutes, but was eager to try. Mom has lots of milk, and compressible tissue, but Guadaluipe seemed frustrated that she was not getting the flow she is used to from a bottle. Due to her small size, she was only able to fit mom's large nipples in her mouth. I advised mom to not try breast feeding longer than 15-20 minutes, so as not to tire her and use too many calories. I tried a 20 nipple shiled on mom's right nipple(smaller than left), and this worked well for a few minutes, but mom preferred trying without. I will follow this family in the NICU.  Patient Name: Girl Jeania Nater [id number] Date: 08/18/2012 Reason for consult: Follow-up assessment;NICU baby   Maternal Data    Feeding Feeding Type: Breast Milk Feeding method: Bottle Nipple Type: Slow - flow Length of feed: 10 min  LATCH Score/Interventions Latch: Repeated attempts needed to sustain latch, nipple held in mouth throughout feeding, stimulation needed to elicit sucking reflex. Intervention(s): Adjust position;Assist with latch;Breast massage;Breast compression  Audible Swallowing: A few with stimulation  Type of Nipple: Everted at rest and  after stimulation (large nipples for small baby, 20 nipple shiled of some help,)  Comfort (Breast/Nipple): Filling, red/small blisters or bruises, mild/mod discomfort  Problem noted: Filling Interventions (Filling): Hand pump  Hold (Positioning): Assistance needed to correctly position infant at breast and maintain latch. Intervention(s): Breastfeeding basics reviewed;Support Pillows;Position options;Skin to skin  LATCH Score: 6  Lactation Tools Discussed/Used Tools: Pump Breast pump type: Manual   Consult Status Consult Status: PRN Follow-up type:  (in NICU)    Alfred Levins 08/18/2012, 2:33 PM

## 2012-09-02 ENCOUNTER — Encounter: Payer: Self-pay | Admitting: *Deleted

## 2012-09-14 ENCOUNTER — Ambulatory Visit (INDEPENDENT_AMBULATORY_CARE_PROVIDER_SITE_OTHER): Payer: Self-pay | Admitting: Obstetrics & Gynecology

## 2012-09-14 ENCOUNTER — Encounter: Payer: Self-pay | Admitting: Obstetrics & Gynecology

## 2012-09-14 NOTE — Progress Notes (Signed)
Patient ID: Monique Hutchinson, female   DOB: 1975-03-01, 37 y.o.   MRN: [id number] Subjective:     Monique Hutchinson is a 37 y.o. female who presents for a postpartum visit. She is 7 weeks postpartum following a spontaneous vaginal delivery. I have fully reviewed the prenatal and intrapartum course. The delivery was at 34 gestational weeks. Outcome: spontaneous vaginal delivery. Anesthesia: spinal. Postpartum course has been uncomlicated. Baby was admitted to the NICU for 2 weeks and discharged.  The course has been uncomplicated. Baby is feeding by breast. Bleeding no bleeding. Bowel function is normal. Bladder function is normal. Patient is not sexually active. Contraception method is condoms. Postpartum depression screening: negative.  The following portions of the patient's history were reviewed and updated as appropriate: allergies, current medications, past family history, past medical history, past social history, past surgical history and problem list.  Review of Systems A comprehensive review of systems was negative.   Objective:    BP 103/57  Pulse 82  Temp(Src) 98.6 F (37 C) (Oral)  Ht 4' 7.5" (1.41 m)  Wt 118 lb (53.524 kg)  BMI 26.92 kg/m2  Breastfeeding? Yes  General:  alert and no distress           Abdomen: soft, non-tender; bowel sounds normal; no masses,  no organomegaly   Vulva:  normal  Vagina: normal vagina  Cervix:  no cervical motion tenderness  Corpus: normal size, contour, position, consistency, mobility, non-tender  Adnexa:  normal adnexa and no mass, fullness, tenderness  Rectal Exam: Not performed.        Assessment:     6 week postpartum exam. Pap smear not done at today's visit.   Plan:    1. Contraception: condoms 2. Keep PNV's until breast feeding completed 3. Follow up in: 1 year at the HD or as needed.

## 2012-09-14 NOTE — Patient Instructions (Signed)
Eleccin del mtodo anticonceptivo  (Contraception Choices) La anticoncepcin (control de la natalidad) es el uso de cualquier mtodo o dispositivo para Location manager. A continuacin se indican algunos de esos mtodos.  MTODOS HORMONALES   Implante anticonceptivo. Es un tubo plstico delgado que contiene la hormona progesterona. No contiene estrgenos. El mdico inserta el tubo en la parte interna del brazo. El tubo puede Geneticist, molecular durante 3 aos. Despus de los 3 aos debe retirarse. El implante impide que los ovarios liberen vulos (ovulacin), espesa el moco cervical, lo que evita que los espermatozoides ingresen al tero y hace ms delgada la membrana que cubre el interior del tero.  Inyecciones de progesterona sola. Estas inyecciones se administran cada 3 meses para evitar el embarazo. La progesterona sinttica impide que los ovarios liberen vulos. Tambin hace que el moco cervical se espese y modifica el recubrimiento interno del tero. Esto hace ms difcil que los espermatozoides sobrevivan en el tero.  Pldoras anticonceptivas. Las pldoras anticonceptivas contienen estrgenos y Education officer, museum. Actan impidiendo que el vulo se forme en el ovario(ovulacin). Las pldoras anticonceptivas son recetadas por el mdico.Tambin se utilizan para tratar los perodos menstruales abundantes.  Minipldora. Este tipo de pldora anticonceptiva contiene slo hormona progesterona. Deben tomarse todos los [address]  [address] del mes y debe recetarlas el mdico.  Parches anticonceptivos. El parche contiene hormonas similares a las que contienen las pldoras anticonceptivas. Deben cambiarse una vez por semana y se utilizan bajo prescripcin mdica.  Anillo vaginal. Anillo vaginal contiene hormonas similares a las que contienen las pldoras anticonceptivas. Se deja colocado durante tres semanas, se lo retira durante 1 semana y luego se coloca uno nuevo. La paciente debe sentirse cmoda para insertar y  retirar el anillo de la vagina.Es necesaria la receta del mdico.  Anticonceptivos de Associate Professor. Los anticonceptivos de emergencia son mtodos para evitar un embarazo despus de una relacin sexual sin proteccin. Esta pldora puede tomarse inmediatamente despus de Child psychotherapist sexuales o hasta 5 [place] de haber tenido sexo sin proteccin. Es ms efectiva si se toma poco tiempo despus. Los anticonceptivos de emergencia estn disponibles sin prescripcin mdica. Consltelo con su farmacutico. No use los anticonceptivos de emergencia como nico mtodo anticonceptivo. MTODOS DE BARRERA   Condn masculino. Es una vaina delgada (ltex o goma) que se [place] en el pene durante el acto sexual. Deri Fuelling con espermicida para aumentar la efectividad.  Condn femenino. Es una vaina blanda y floja que se adapta suavemente a la vagina antes de las relaciones sexuales.  Diafragma. Es una barrera de ltex redonda y Casimer Bilis que debe ser ajustada por un profesional. Se inserta en la vagina, junto con un gel espermicida. Debe insertarse antes de Management consultant. Debe dejar el diafragma colocado en la vagina durante 6 a 8 horas despus de la relacin sexual.  Capuchn cervical. Es una taza de ltex o plstico, redonda y [place] que cubre el cuello del tero y debe ser ajustada por un mdico. Puede dejarlo colocado en la vagina hasta 48 horas despus de las Clinical research associate.  Esponja. Es una pieza blanda y circular de espuma de poliuretano. Contiene un espermicida. Se inserta en la vagina despus de mojarla y antes de las [employer].  Espermicidas. Los espermicidas son qumicos que matan o bloquean el esperma y no lo dejan ingresar al cuello del tero y al tero. Vienen en forma de cremas, geles, supositorios, espuma o comprimidos. No es necesario tener Emergency planning/management officer. Se insertan en la vagina con un aplicador  antes de [employer]. El proceso debe repetirse cada vez que tiene  relaciones sexuales. ANTICONCEPTIVOS INTRAUTERINOS   Dispositivo intrauterino (DIU). Es un dispositivo en forma de T que se coloca en el tero durante el perodo menstrual, para Location manager. Hay dos tipos:  DIU de cobre. Este tipo de DIU est recubierto con un alambre de cobre y se inserta dentro del tero. El cobre hace que el tero y las trompas de Falopio produzcan un liquido que [employer] espermatozoides. Puede permanecer colocado durante 10 aos.  DIU hormonal. Este tipo de DIU contiene la hormona progestina (progesterona sinttica). La hormona espesa el moco cervical y evita que los espermatozoides ingresen al tero y tambin afina la membrana que cubre el tero para evitar la implantacin del vulo fertilizado. La hormona debilita o destruye los espermatozoides que ingresan al tero. Puede permanecer colocado durante 5 aos. MTODOS ANTICONCEPTIVOS PERMANENTES   Ligadura de trompas en la mujer. La ligadura de trompas en la mujer se realiza sellando, atando u obstruyendo quirrgicamente las trompas de Falopio lo que impide que el vulo descienda hacia el tero.  Esterilizacin masculina. Se realiza atando los conductos por los que pasan los espermatozoides (vasectoma).Esto impide que el esperma ingrese a la vagina durante el acto sexual. Luego del procedimiento, el hombre puede eyacular lquido (semen). MTODOS DE PLANIFICACIN NATURAL   Planificacin familiar natural.  Consiste en no tener relaciones sexuales o usar un mtodo de barrera (condn, [place], capuchn cervical) en los [employer] la mujer podra quedar [place].  Mtodo calendario.  Consiste en el seguimiento de la duracin de cada ciclo menstrual y la identificacin de los perodos frtiles.  Mtodo de Occupational hygienist.  Consiste en evitar las relaciones sexuales durante la ovulacin.  Mtodo sintotrmico. Paramedic las relaciones sexuales en la poca en la que se est ovulando, utilizando un termmetro y  tendiendo en cuenta los sntomas de la ovulacin.  Mtodo post-ovulacin. Consiste en planificar las relaciones sexuales para despus de haber ovulado. Independientemente del tipo o mtodo anticonceptivo que usted elija, es importante que use condones para protegerse contra las enfermedades de transmisin sexual (ETS). Hable con su mdico con respecto a qu mtodo anticonceptivo es el ms apropiado para usted.  Document Released: 01/19/2005 Document Revised: 04/13/2011 [facility] Patient Information 2014 [place], [place].

## 2013-12-04 ENCOUNTER — Encounter: Payer: Self-pay | Admitting: Obstetrics & Gynecology

## 2015-07-04 ENCOUNTER — Ambulatory Visit (INDEPENDENT_AMBULATORY_CARE_PROVIDER_SITE_OTHER): Payer: Self-pay | Admitting: Internal Medicine

## 2015-07-04 ENCOUNTER — Encounter: Payer: Self-pay | Admitting: Internal Medicine

## 2015-07-04 VITALS — BP 102/50 | HR 64 | Temp 98.3°F | Resp 18 | Ht <= 58 in | Wt 129.0 lb

## 2015-07-04 DIAGNOSIS — Z79899 Other long term (current) drug therapy: Secondary | ICD-10-CM

## 2015-07-04 MED ORDER — TERBINAFINE HCL 250 MG PO TABS: 250.0000 mg | ORAL_TABLET | Freq: Every day | ORAL | Status: DC

## 2015-07-04 NOTE — Progress Notes (Signed)
   Subjective:    Patient ID: Monique Hutchinson, female    DOB: 1975-12-08, 40 y.o.   MRN: [id number]  HPI   1.  Toenail and thumbnail thickening and discoloration:  Took Terbinafine for 90 day course, ending in December.  Her nails improved, but did not completely resolve and worsened after stopping the med.  Did not treat her shoes during and after her medication treatment.  Does wear latex gloves when working, though changes out frequently.  Meds:  MV  No Known Allergies   Past Medical History  Diagnosis Date  . GERD (gastroesophageal reflux disease)     When pregnant only  . 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 Leg surgery Right Age 425-40 years old    Due to osteomyelitis of right tibia  . Cesarean section  2010  . Cholecystectomy  2010    Laparoscopic   Social History   Social History  . Marital Status: Single    Spouse Name: N/A  . Number of Children: 2  . Years of Education: 9   Occupational History  . Works at [employer]    Social History Main Topics  . Smoking status: Never Smoker   . Smokeless tobacco: Never Used  . Alcohol Use: No  . Drug Use: No  . Sexual Activity:    Partners: Male    Birth Control/ Protection: Condom   Other Topics Concern  . Not on file   Social History Narrative   Originally [place]   [place]   Lives at home with her 207 yo son and 383 yo daughter   Father of children currently does not live with them.   He is supportive   History reviewed. No pertinent family history.    Review of Systems     Objective:   Physical Exam Lungs:  CTA CV:  RRR with normal S1 and S2, No S3, S4, or murmur, radial pulses normal and equal. Right hand fingers with thickening and yellowing involving all nails except middle finger.  Periungual flaking and skin thickening as  well Great toenails bilaterally with distal yellowing and thickening, crumbling.       Assessment & Plan:  Nail onychomycosis:  Terbinafine 250 mg once daily for 90 days.  Treat shoes and shower floor regularly to avoid reinfection. Liver profile today. Follow up in 6 weeks.

## 2015-07-04 NOTE — Patient Instructions (Addendum)
Terbinafine cream 1% sin receta dos veces al dia alrededor de la una Spray zapatas con Lysol diaro  Limpia piso de la Liz Claibornetina dos veces la semana

## 2015-07-05 LAB — HEPATIC FUNCTION PANEL
ALBUMIN: 4.2 g/dL (ref 3.5–5.5)
ALT: 19 IU/L (ref 0–32)
AST: 24 IU/L (ref 0–40)
Alkaline Phosphatase: 88 IU/L (ref 39–117)
BILIRUBIN, DIRECT: 0.06 mg/dL (ref 0.00–0.40)
Total Protein: 6.6 g/dL (ref 6.0–8.5)

## 2015-07-24 NOTE — Progress Notes (Signed)
Patient informed. 

## 2015-08-15 ENCOUNTER — Ambulatory Visit (INDEPENDENT_AMBULATORY_CARE_PROVIDER_SITE_OTHER): Payer: Self-pay | Admitting: Internal Medicine

## 2015-08-15 ENCOUNTER — Encounter: Payer: Self-pay | Admitting: Internal Medicine

## 2015-08-15 VITALS — BP 100/50 | HR 54 | Resp 16 | Ht <= 58 in | Wt 129.0 lb

## 2015-08-15 DIAGNOSIS — R5383 Other fatigue: Secondary | ICD-10-CM

## 2015-08-15 DIAGNOSIS — B351 Tinea unguium: Secondary | ICD-10-CM

## 2015-08-15 NOTE — Progress Notes (Signed)
   Subjective:    Patient ID: Monique Hutchinson, female    DOB: [birth year], 40 y.o.   MRN: [id number]  HPI  Onychomycosis, both fingernails and toenails:  Pt. Shares she was not able to get started on her Terbinafine until 20 days ago as we did not get back to her regarding her liver enzymes results until mid June.   Is spraying shoes and cleaning shower floor.  Changing gloves regularly to keep hands dry.  Fatigue in the morning.  Gets up at 5 in the morning.  Bedtime at 10-11 p.m. Sleeps through the night.  Fatigued until about 9 a.m. And then fine.  Started about 10 days ago.  Has been feeling well otherwise  Current Meds  Medication Sig  . Multiple Vitamin (MULTIVITAMIN) tablet Take 1 tablet by mouth daily.  Marland Kitchen. terbinafine (LAMISIL) 250 MG tablet Take 1 tablet (250 mg total) by mouth daily. (Patient not taking: Reported on 08/29/2015)   No Known Allergies  Review of Systems     Objective:   Physical Exam  NAD HEENT:  PERRL, EOMI, Sclera without icterus.  TMs pearly gray, throat without injection Neck:  Supple, No adenopathy, no thyromegaly,  Chest:  CTA CV:  RRR without murmur or rub, radial pulses normal and equal Abd:  S, NT, No HSM or mass, + BS Toenails and thumbnails:  No significant change in thickening and discoloration, periungual flaking.        Assessment & Plan:  1.  Toenail and thumbnail Onychomycosis:  No significant change as would not expect with only 20 days of treatment. CPM and return when at 6 weeks of treatment.  2.  Fatigue:  Reportedly mild and short lived.  To call if continues or worsens.  No findings on exam today.

## 2015-08-29 ENCOUNTER — Ambulatory Visit (INDEPENDENT_AMBULATORY_CARE_PROVIDER_SITE_OTHER): Payer: Self-pay | Admitting: Internal Medicine

## 2015-08-29 VITALS — BP 104/70 | HR 83 | Temp 98.9°F | Resp 16 | Ht <= 58 in | Wt 128.0 lb

## 2015-08-29 DIAGNOSIS — R1011 Right upper quadrant pain: Secondary | ICD-10-CM

## 2015-08-29 DIAGNOSIS — M25571 Pain in right ankle and joints of right foot: Secondary | ICD-10-CM

## 2015-08-29 DIAGNOSIS — B351 Tinea unguium: Secondary | ICD-10-CM

## 2015-08-29 LAB — POCT URINALYSIS DIPSTICK
Bilirubin, UA: NEGATIVE
Blood, UA: NEGATIVE
Glucose, UA: NEGATIVE
KETONES UA: NEGATIVE
Nitrite, UA: NEGATIVE
PH UA: 5
PROTEIN UA: NEGATIVE
Spec Grav, UA: 1.015
UROBILINOGEN UA: 0.2

## 2015-08-29 NOTE — Progress Notes (Addendum)
   Subjective:    Patient ID: Monique Hutchinson, female    DOB: 09-06-1975, 40 y.o.   MRN: [id number]  HPI   1.  Onychomycosis of fingernails and toenails:  Had taken about 30 days of Terbinafine.  Stopped taking Terbinafine 5 days ago as she developed abdominal pain.  Describes RUQ pain radiating to back.  Felt like her abdomen was inflamed.  Radiated to her back on the right.   Had barbecue beef with rice tortillas midday and had cheese tortillas with milk at night.  No fried food.   Pain started that night while trying to sleep.   Last had something to eat at 9 pm and pain awakened her at midnight.  Rolled onto back and left side and the pain decreased.   Pain continued through the night and awakened with the pain the following day.  Continued until yesterday actually (Wednesday)  Still feels a bit of discomfort today.   No nausea, vomiting, fever, diarrhea, constipation with the pain.  No change in stool color.  No other medication.    2.  Right ankle pain:  Pt. Brings this up at end of visit as finishing.  Has been hurting her for 2 days.  History of osteomyelitis involving distal tibia requiring significant surgery when a child.  Deep scarring from this just above ankle.  No definite redness or soft tissue swelling.  No definite injury.    Current Outpatient Prescriptions:  Marland Kitchen  Multiple Vitamin (MULTIVITAMIN) tablet, Take 1 tablet by mouth daily., Disp: , Rfl:  .  terbinafine (LAMISIL) 250 MG tablet, Take 1 tablet (250 mg total) by mouth daily. (Patient not taking: Reported on 08/29/2015), Disp: 90 tablet, Rfl: 0   No Known Allergies    Review of Systems     Objective:   Physical Exam   NAD Lungs:  CTA CV:  RRR with normal S1 and S2, No S3, S4 or murmur.  RAdial pulses normal and equal. Abd:  S, mild RUQ tenderness.  No Murphy's sign, no rebound or peritoneal sign, + BS, No HSM or mass. Right hand fingernails still with thickening and yellowing involving all but middle finger  nail.  Periungual flaking mildly improved. Great toenails still similarly involved  Right ankle:  Deeply scarred pretibial area without change just above ankle.  Ankle itself with chronic bony hypertrophic changes.  Mildly decreased ROM, no erythema.  Mildly tender anterior ankle joint.  No laxity with stress of mortis.         Assessment & Plan:  1.  RUQ Pain:  Concern for gallbladder etiology(later found she had had cholecystectomy in 2010--so looking for changes may have other biliary etiology.  Check CBC, Hepatic function panel.  Avoid heavy, fried, fatty foods.  Hold on Terbinafine for now, though unlikely cause of pain.  2.  Fingernail and Toenail onychomycosis:  Hold on treatment with Terbinafine for now.  3.  Right ankle pain:  Consider Xray if continues to be a problem.  Will be difficult to get her in to ortho via orange card currently.  Hold on any NSAIDS for now with her abdominal issues.

## 2015-08-29 NOTE — Patient Instructions (Signed)
habla clinica Lunes si no escucha para clinica

## 2015-08-30 LAB — CBC WITH DIFFERENTIAL/PLATELET
BASOS: 0 %
Basophils Absolute: 0 10*3/uL (ref 0.0–0.2)
EOS (ABSOLUTE): 0.3 10*3/uL (ref 0.0–0.4)
EOS: 2 %
HEMATOCRIT: 36.5 % (ref 34.0–46.6)
Hemoglobin: 11.7 g/dL (ref 11.1–15.9)
IMMATURE GRANS (ABS): 0 10*3/uL (ref 0.0–0.1)
IMMATURE GRANULOCYTES: 0 %
LYMPHS ABS: 2.3 10*3/uL (ref 0.7–3.1)
Lymphs: 15 %
MCH: 24 pg — ABNORMAL LOW (ref 26.6–33.0)
MCHC: 32.1 g/dL (ref 31.5–35.7)
MCV: 75 fL — AB (ref 79–97)
MONOCYTES: 6 %
Monocytes Absolute: 0.9 10*3/uL (ref 0.1–0.9)
NEUTROS ABS: 11.7 10*3/uL — AB (ref 1.4–7.0)
Neutrophils: 77 %
Platelets: 361 10*3/uL (ref 150–379)
RBC: 4.87 x10E6/uL (ref 3.77–5.28)
RDW: 15 % (ref 12.3–15.4)
WBC: 15.2 10*3/uL — AB (ref 3.4–10.8)

## 2015-08-30 LAB — HEPATIC FUNCTION PANEL
ALBUMIN: 4.4 g/dL (ref 3.5–5.5)
ALT: 22 IU/L (ref 0–32)
AST: 29 IU/L (ref 0–40)
Alkaline Phosphatase: 93 IU/L (ref 39–117)
BILIRUBIN TOTAL: 0.3 mg/dL (ref 0.0–1.2)
BILIRUBIN, DIRECT: 0.07 mg/dL (ref 0.00–0.40)
TOTAL PROTEIN: 7.1 g/dL (ref 6.0–8.5)

## 2015-09-04 NOTE — Progress Notes (Signed)
Patient confirmed appointment today  for ultrasound at [facility]  09/09/2015 @ 11:15 am. All information needed given over the phone to patient

## 2015-09-09 ENCOUNTER — Ambulatory Visit (HOSPITAL_COMMUNITY)
Admission: RE | Admit: 2015-09-09 | Discharge: 2015-09-09 | Disposition: A | Discharge status: Home / Self Care | Payer: Self-pay | Source: Ambulatory Visit | Attending: Internal Medicine | Admitting: Internal Medicine

## 2015-09-09 DIAGNOSIS — M549 Dorsalgia, unspecified: Secondary | ICD-10-CM | POA: Insufficient documentation

## 2015-09-09 DIAGNOSIS — R1011 Right upper quadrant pain: Secondary | ICD-10-CM | POA: Insufficient documentation

## 2015-09-09 DIAGNOSIS — Z9049 Acquired absence of other specified parts of digestive tract: Secondary | ICD-10-CM | POA: Insufficient documentation

## 2015-09-10 ENCOUNTER — Encounter: Payer: Self-pay | Admitting: Internal Medicine

## 2015-09-16 ENCOUNTER — Other Ambulatory Visit: Payer: Self-pay

## 2015-09-17 ENCOUNTER — Other Ambulatory Visit (INDEPENDENT_AMBULATORY_CARE_PROVIDER_SITE_OTHER): Payer: Self-pay

## 2015-09-17 DIAGNOSIS — D72829 Elevated white blood cell count, unspecified: Secondary | ICD-10-CM

## 2015-09-18 LAB — CBC WITH DIFFERENTIAL/PLATELET
BASOS ABS: 0.1 10*3/uL (ref 0.0–0.2)
BASOS: 1 %
EOS (ABSOLUTE): 0.4 10*3/uL (ref 0.0–0.4)
Eos: 5 %
HEMOGLOBIN: 10.6 g/dL — AB (ref 11.1–15.9)
Hematocrit: 34.3 % (ref 34.0–46.6)
IMMATURE GRANS (ABS): 0 10*3/uL (ref 0.0–0.1)
Immature Granulocytes: 0 %
LYMPHS ABS: 2.9 10*3/uL (ref 0.7–3.1)
LYMPHS: 33 %
MCH: 23.5 pg — AB (ref 26.6–33.0)
MCHC: 30.9 g/dL — AB (ref 31.5–35.7)
MCV: 76 fL — AB (ref 79–97)
MONOCYTES: 6 %
Monocytes Absolute: 0.5 10*3/uL (ref 0.1–0.9)
NEUTROS ABS: 4.8 10*3/uL (ref 1.4–7.0)
Neutrophils: 55 %
Platelets: 374 10*3/uL (ref 150–379)
RBC: 4.52 x10E6/uL (ref 3.77–5.28)
RDW: 15.3 % (ref 12.3–15.4)
WBC: 8.7 10*3/uL (ref 3.4–10.8)

## 2015-09-18 NOTE — Progress Notes (Signed)
Pt informed

## 2015-10-18 ENCOUNTER — Ambulatory Visit (INDEPENDENT_AMBULATORY_CARE_PROVIDER_SITE_OTHER): Payer: PRIVATE HEALTH INSURANCE | Admitting: Internal Medicine

## 2015-10-18 VITALS — BP 100/60 | HR 66 | Temp 98.6°F | Resp 16 | Ht <= 58 in | Wt 132.0 lb

## 2015-10-18 DIAGNOSIS — D509 Iron deficiency anemia, unspecified: Secondary | ICD-10-CM

## 2015-10-18 DIAGNOSIS — B351 Tinea unguium: Secondary | ICD-10-CM

## 2015-10-18 DIAGNOSIS — D649 Anemia, unspecified: Secondary | ICD-10-CM

## 2015-10-18 LAB — POCT HEMOGLOBIN: Hemoglobin: 15 g/dL (ref 12.2–16.2)

## 2015-10-18 MED ORDER — TERBINAFINE HCL 1 % EX CREA: 1.0000 "application " | TOPICAL_CREAM | Freq: Two times a day (BID) | CUTANEOUS | 0 refills | Status: DC

## 2015-10-18 NOTE — Patient Instructions (Signed)
Apply terbinafine cream to all fingernails, then vaseline, then cover with cotton gloves and wear all night.   Apply terbinafine cream to fingernails every time you change your gloves at work.

## 2015-10-18 NOTE — Progress Notes (Signed)
   Subjective:    Patient ID: Monique Hutchinson, female    DOB: Jul 17, 1975, 40 y.o.   MRN: [id number]  HPI   1.  Anemia:  Her hemoglobin is normal today at 14.  2.  Toenail and fingernail onychomycosis:  Has 40 days of 90 day course of treatment with Terbinafine.  Still with similar discomfort of periungual area of fingernails .  Her thumbnail on the left has shown some improvement  Current Meds  Medication Sig  . Multiple Vitamin (MULTIVITAMIN) tablet Take 1 tablet by mouth daily.  Marland Kitchen. terbinafine (LAMISIL) 250 MG tablet Take 1 tablet (250 mg total) by mouth daily.   No Known Allergies  Review of Systems     Objective:   Physical Exam  Fingernails still with flaking, erythema, irritation around nailbeds--patient rubs at them constantly during exam.  Several nails still with significant yellow and flaking almost to base. Great toenail seems to be clearing from base somewhat.  Little toenail still yellowed and thickened.      Assessment & Plan:  1. Anemia:  Resolved with iron supplementation.  2.  Fingernail and toenail onychomycosis:  Not clear if this is not as improved as would expect due to interruption in treatment.  Will have her obtain Terbinafine cream and apply to nailbeds as well.  Follow up in 4 weeks.  If not improved, may need to switch to another medication or clip nails after discontinue treatment for a bit to isolate cause.

## 2015-11-22 ENCOUNTER — Ambulatory Visit (INDEPENDENT_AMBULATORY_CARE_PROVIDER_SITE_OTHER): Payer: Self-pay | Admitting: Internal Medicine

## 2015-11-22 ENCOUNTER — Encounter: Payer: Self-pay | Admitting: Internal Medicine

## 2015-11-22 VITALS — BP 108/70 | HR 72 | Ht <= 58 in | Wt 130.0 lb

## 2015-11-22 DIAGNOSIS — B351 Tinea unguium: Secondary | ICD-10-CM

## 2015-11-22 NOTE — Progress Notes (Signed)
   Subjective:    Patient ID: Monique Hutchinson, female    DOB: Mar 01, 1975, 40 y.o.   MRN: [id number]  HPI  Fingernail and toenail onychomycosis.  Has 10 days left of 90 day oral Terbinafine.  Has also been using topical as discussed previously, but has not noted any improvement.   Current Meds  Medication Sig  . Multiple Vitamin (MULTIVITAMIN) tablet Take 1 tablet by mouth daily.  Marland Kitchen. terbinafine (LAMISIL) 1 % cream Apply 1 application topically 2 (two) times daily.  Marland Kitchen. terbinafine (LAMISIL) 250 MG tablet Take 1 tablet (250 mg total) by mouth daily.   No Known Allergies  Review of Systems     Objective:   Physical Exam Thumbs, ring and index fingernails as well as great toenails bilaterally involved with discoloration and thickening at lateral and distal edges.  Right ring finger with brown discoloration at base.       Assessment & Plan:  .  Toenail and fingernail onychomycosis:  No real improvement.  To stop the Terbinafine topical and oral.  Return in 2 weeks to obtain nail clippings for fungal culture.   Lab visit only

## 2015-11-22 NOTE — Patient Instructions (Signed)
No mas medicamente para ongo--crema o pastilla

## 2015-12-01 ENCOUNTER — Encounter: Payer: Self-pay | Admitting: Internal Medicine

## 2015-12-06 ENCOUNTER — Ambulatory Visit: Payer: Self-pay | Admitting: Internal Medicine

## 2015-12-06 ENCOUNTER — Encounter: Payer: Self-pay | Admitting: Internal Medicine

## 2015-12-06 VITALS — BP 110/68 | HR 68 | Ht <= 58 in | Wt 131.0 lb

## 2015-12-06 DIAGNOSIS — B351 Tinea unguium: Secondary | ICD-10-CM

## 2015-12-06 NOTE — Progress Notes (Signed)
Here to provide dermatophyte/fungal infected nail sample for fungal culture and sensitivity.  Nails have not grown much. Will return in 2 weeks to see if more involved nail available.

## 2015-12-19 ENCOUNTER — Ambulatory Visit (INDEPENDENT_AMBULATORY_CARE_PROVIDER_SITE_OTHER): Payer: Self-pay | Admitting: Internal Medicine

## 2015-12-19 ENCOUNTER — Encounter: Payer: Self-pay | Admitting: Internal Medicine

## 2015-12-19 VITALS — BP 102/64 | HR 66 | Resp 12 | Ht <= 58 in | Wt 134.0 lb

## 2015-12-19 DIAGNOSIS — B351 Tinea unguium: Secondary | ICD-10-CM

## 2015-12-19 NOTE — Progress Notes (Signed)
Here for fingernail shaving, clipping to send for fungal culture.  Several fingernails, toenails clipped and shaved for fungal culture specimen.

## 2016-01-01 NOTE — Progress Notes (Signed)
Spoke with [facility] and for this test they do not do sensitivity. Dr. Delrae AlfredMulberry informed.

## 2016-01-02 ENCOUNTER — Other Ambulatory Visit: Payer: Self-pay | Admitting: Internal Medicine

## 2016-01-02 MED ORDER — ITRACONAZOLE 200 MG PO TABS: 200.0000 mg | ORAL_TABLET | Freq: Every day | ORAL | 0 refills | Status: DC

## 2016-01-11 LAB — FUNGUS CULTURE, BLOOD

## 2016-01-14 NOTE — Progress Notes (Signed)
Pat spoke with Monique Hutchinson of [facility] program.  She does not find or see that an application has been started for this patient for ltraconazole.

## 2016-04-16 ENCOUNTER — Other Ambulatory Visit: Payer: Self-pay

## 2016-04-16 MED ORDER — ITRACONAZOLE 200 MG PO TABS: 200.0000 mg | ORAL_TABLET | Freq: Every day | ORAL | 0 refills | Status: DC

## 2016-04-23 ENCOUNTER — Encounter: Payer: Self-pay | Admitting: Internal Medicine

## 2016-04-23 ENCOUNTER — Ambulatory Visit (INDEPENDENT_AMBULATORY_CARE_PROVIDER_SITE_OTHER): Payer: Self-pay | Admitting: Internal Medicine

## 2016-04-23 VITALS — BP 118/72 | HR 66 | Resp 12 | Ht <= 58 in | Wt 134.0 lb

## 2016-04-23 DIAGNOSIS — N912 Amenorrhea, unspecified: Secondary | ICD-10-CM

## 2016-04-23 DIAGNOSIS — R102 Pelvic and perineal pain unspecified side: Secondary | ICD-10-CM

## 2016-04-23 DIAGNOSIS — O26899 Other specified pregnancy related conditions, unspecified trimester: Secondary | ICD-10-CM

## 2016-04-23 LAB — POCT URINE PREGNANCY: PREG TEST UR: POSITIVE — AB

## 2016-04-23 MED ORDER — PRENATAL PLUS 27-1 MG PO TABS: ORAL_TABLET | ORAL | 11 refills | Status: DC

## 2016-04-23 NOTE — Patient Instructions (Signed)
Do not take Itraconazole until you have your baby and are done with breastfeeding. If you decide to start taking the medication, call to set up follow up with labs and visits at the clinic.

## 2016-04-23 NOTE — Progress Notes (Signed)
   Subjective:    Patient ID: Monique Hutchinson, female    DOB: [birth year], 41 y.o.   MRN: [id number]  HPI   1.  Toenail and fingernail onychomycosis:  Did not understand she was to get [place] drug [place] [facility] [place] [facility] [place] to get the Itraconazole.  Has the card, but will have to reapply in another year.  She states she just received the card this month (March) Discussed she could get it filled for the 90 days, then hold until she is done with delivering her baby and breast feeding.    2.  Having left low pelvic pain that radiates to back and down buttock to lateral upper thigh.  Has had this on and off since about 2 weeks after her last period, beginning of February.  The pain is not very severe.   Has a very positive urine HCG today. No vaginal bleeding or spotting.  No light headedness.  G3P2, had caesarian with one birth.  Both children alive and healthy.  No SAB or TAB  No outpatient prescriptions have been marked as taking for the 04/23/16 encounter (Office Visit) with Julieanne MansonElizabeth Khaliah Barnick, MD.   No Known Allergies      Review of Systems     Objective:   Physical Exam  NAD Lungs:  CTA CV:  RRR without murmur or rub, radial pulses normal and equa. Abd:  S, mild tenderness left pelvic area, No rebound or peritoneal signs,  + BS, No HSM or mass.        Assessment & Plan:  Left pelvic pain: at about the right time for a possible tubal pregnancy to become symptomatic.  Check CBC with platelet count and obtain a pelvic ultrasound to define if intrauterine pregnancy  Nail onychomycosis with culture showing dermatophyte that should respond to Itraconazole.  Did not respond to Terbinafine previously.  Holding on treatment for now.  Consider treatment after she delivers and is done nursing, which may be a couple of years.

## 2016-04-24 LAB — CBC WITH DIFFERENTIAL/PLATELET
BASOS ABS: 0 10*3/uL (ref 0.0–0.2)
Basos: 0 %
EOS (ABSOLUTE): 0.3 10*3/uL (ref 0.0–0.4)
Eos: 2 %
Hematocrit: 36.3 % (ref 34.0–46.6)
Hemoglobin: 11.8 g/dL (ref 11.1–15.9)
IMMATURE GRANULOCYTES: 0 %
Immature Grans (Abs): 0 10*3/uL (ref 0.0–0.1)
Lymphocytes Absolute: 3.2 10*3/uL — ABNORMAL HIGH (ref 0.7–3.1)
Lymphs: 26 %
MCH: 25.6 pg — ABNORMAL LOW (ref 26.6–33.0)
MCHC: 32.5 g/dL (ref 31.5–35.7)
MCV: 79 fL (ref 79–97)
MONOS ABS: 0.7 10*3/uL (ref 0.1–0.9)
Monocytes: 6 %
NEUTROS PCT: 66 %
Neutrophils Absolute: 8.2 10*3/uL — ABNORMAL HIGH (ref 1.4–7.0)
PLATELETS: 294 10*3/uL (ref 150–379)
RBC: 4.61 x10E6/uL (ref 3.77–5.28)
RDW: 15.2 % (ref 12.3–15.4)
WBC: 12.4 10*3/uL — AB (ref 3.4–10.8)

## 2016-04-27 ENCOUNTER — Telehealth: Payer: Self-pay | Admitting: Internal Medicine

## 2016-04-27 NOTE — Telephone Encounter (Signed)
LVM for patient to return call to our office so we can give her the Ultrasound appointment information at [facility] on Thursday, April 30, 2016 at 11:00 am. Patient to arrive at 10:45

## 2016-04-28 NOTE — Telephone Encounter (Signed)
Patient returned phone call today. Appointment information about Ultrasound given to patient

## 2016-04-30 ENCOUNTER — Ambulatory Visit (HOSPITAL_COMMUNITY)
Admission: RE | Admit: 2016-04-30 | Discharge: 2016-04-30 | Disposition: A | Discharge status: Home / Self Care | Payer: Self-pay | Source: Ambulatory Visit | Attending: Internal Medicine | Admitting: Internal Medicine

## 2016-04-30 DIAGNOSIS — O26899 Other specified pregnancy related conditions, unspecified trimester: Secondary | ICD-10-CM

## 2016-04-30 DIAGNOSIS — O208 Other hemorrhage in early pregnancy: Secondary | ICD-10-CM | POA: Insufficient documentation

## 2016-04-30 DIAGNOSIS — R102 Pelvic and perineal pain: Secondary | ICD-10-CM | POA: Insufficient documentation

## 2016-04-30 DIAGNOSIS — Z3A08 8 weeks gestation of pregnancy: Secondary | ICD-10-CM | POA: Insufficient documentation

## 2016-06-18 ENCOUNTER — Other Ambulatory Visit: Payer: Self-pay

## 2016-06-18 MED ORDER — ITRACONAZOLE 200 MG PO TABS: 200.0000 mg | ORAL_TABLET | Freq: Every day | ORAL | 0 refills | Status: DC

## 2016-06-22 LAB — OB RESULTS CONSOLE HIV ANTIBODY (ROUTINE TESTING): HIV: NONREACTIVE

## 2016-06-22 LAB — OB RESULTS CONSOLE ABO/RH: RH Type: POSITIVE

## 2016-06-22 LAB — CYSTIC FIBROSIS DIAGNOSTIC STUDY: Interpretation-CFDNA:: NEGATIVE

## 2016-06-22 LAB — OB RESULTS CONSOLE RPR: RPR: NONREACTIVE

## 2016-06-22 LAB — OB RESULTS CONSOLE HEPATITIS B SURFACE ANTIGEN: HEP B S AG: NEGATIVE

## 2016-06-22 LAB — OB RESULTS CONSOLE GC/CHLAMYDIA
CHLAMYDIA, DNA PROBE: NEGATIVE
GC PROBE AMP, GENITAL: NEGATIVE

## 2016-06-22 LAB — OB RESULTS CONSOLE ANTIBODY SCREEN: ANTIBODY SCREEN: NEGATIVE

## 2016-06-22 LAB — OB RESULTS CONSOLE RUBELLA ANTIBODY, IGM: RUBELLA: IMMUNE

## 2016-06-23 ENCOUNTER — Other Ambulatory Visit (HOSPITAL_COMMUNITY): Payer: Self-pay | Admitting: Nurse Practitioner

## 2016-06-23 DIAGNOSIS — O09522 Supervision of elderly multigravida, second trimester: Secondary | ICD-10-CM

## 2016-07-02 ENCOUNTER — Telehealth: Payer: Self-pay | Admitting: Internal Medicine

## 2016-07-08 ENCOUNTER — Encounter (HOSPITAL_COMMUNITY): Payer: Self-pay | Admitting: *Deleted

## 2016-07-09 ENCOUNTER — Ambulatory Visit (HOSPITAL_COMMUNITY)
Admission: RE | Admit: 2016-07-09 | Discharge: 2016-07-09 | Disposition: A | Discharge status: Home / Self Care | Payer: Self-pay | Source: Ambulatory Visit | Attending: Nurse Practitioner | Admitting: Nurse Practitioner

## 2016-07-09 ENCOUNTER — Encounter (HOSPITAL_COMMUNITY): Payer: Self-pay

## 2016-07-09 ENCOUNTER — Other Ambulatory Visit (HOSPITAL_COMMUNITY): Payer: Self-pay | Admitting: Nurse Practitioner

## 2016-07-09 DIAGNOSIS — O09212 Supervision of pregnancy with history of pre-term labor, second trimester: Secondary | ICD-10-CM | POA: Insufficient documentation

## 2016-07-09 DIAGNOSIS — O09892 Supervision of other high risk pregnancies, second trimester: Secondary | ICD-10-CM

## 2016-07-09 DIAGNOSIS — O99212 Obesity complicating pregnancy, second trimester: Secondary | ICD-10-CM

## 2016-07-09 DIAGNOSIS — Z3A18 18 weeks gestation of pregnancy: Secondary | ICD-10-CM | POA: Insufficient documentation

## 2016-07-09 DIAGNOSIS — O34219 Maternal care for unspecified type scar from previous cesarean delivery: Secondary | ICD-10-CM | POA: Insufficient documentation

## 2016-07-09 DIAGNOSIS — O09522 Supervision of elderly multigravida, second trimester: Secondary | ICD-10-CM

## 2016-07-09 DIAGNOSIS — O09529 Supervision of elderly multigravida, unspecified trimester: Secondary | ICD-10-CM

## 2016-07-09 DIAGNOSIS — O9989 Other specified diseases and conditions complicating pregnancy, childbirth and the puerperium: Secondary | ICD-10-CM | POA: Insufficient documentation

## 2016-07-09 DIAGNOSIS — O09292 Supervision of pregnancy with other poor reproductive or obstetric history, second trimester: Secondary | ICD-10-CM | POA: Insufficient documentation

## 2016-07-09 DIAGNOSIS — Z363 Encounter for antenatal screening for malformations: Secondary | ICD-10-CM | POA: Insufficient documentation

## 2016-07-09 DIAGNOSIS — O09219 Supervision of pregnancy with history of pre-term labor, unspecified trimester: Secondary | ICD-10-CM | POA: Insufficient documentation

## 2016-07-09 NOTE — Progress Notes (Signed)
Genetic Counseling  High-Risk Gestation Note  Appointment Date:  07/09/2016 Referred By: Trina Ao, NP Date of Birth:  Jun 03, 1975   Pregnancy History: [id number] Estimated Date of Delivery: 12/10/16 Estimated Gestational Age: [redacted]w[redacted]d Attending: Eulis Foster, MD    Monique Hutchinson was seen for genetic counseling because of a maternal age of 41 y.o..   [facility] English/Spanish interpreter, Cicero Duck, provided interpretation for today's visit.   In summary:  Discussed AMA and associated risk for fetal aneuploidy  Reviewed results of previous Quad screen- within normal limits  Discussed options for screening  NIPS- declined  Ultrasound- performed today  Discussed diagnostic testing options  Amniocentesis- declined  Reviewed family history concerns  Discussed carrier screening options  CF  SMA  Hemoglobinopathies  She was counseled regarding maternal age and the association with risk for chromosome conditions due to nondisjunction with aging of the ova.   We reviewed chromosomes, nondisjunction, and the associated 1 in 69 risk for fetal aneuploidy related to a maternal age of 41 y.o. at [redacted]w[redacted]d gestation.  She was counseled that the risk for aneuploidy decreases as gestational age increases, accounting for those pregnancies which spontaneously abort.  We specifically discussed Down syndrome (trisomy 13), trisomies 6 and 31, and sex chromosome aneuploidies (47,XXX and 47,XXY) including the common features and prognoses of each.   We reviewed results of [name] screen, which was within normal limits for the conditions screened. She was counseled that screening tests are used to modify a patient's a priori risk for aneuploidy, typically based on age. This estimate provides a pregnancy specific risk assessment.  We reviewed the sensitivity and the reduction in risks for fetal Down syndrome (1 in 652), Trisomy 18 (1 in 2674) and ONTDs. She understands this is  not diagnostic and does not assess for all chromosome conditions.   We reviewed available screening options including noninvasive prenatal screening (NIPS)/cell free DNA (cfDNA) screening and detailed ultrasound.  We reviewed the benefits and limitations of each option. Specifically, we discussed the conditions for which each test screens, the detection rates, and false positive rates of each. She was also counseled regarding diagnostic testing via amniocentesis. We reviewed the approximate 1 in 300-500 risk for complications from amniocentesis, including spontaneous pregnancy loss. We discussed the possible results that the tests might provide including: positive, negative, unanticipated, and no result. Finally, they were counseled regarding the cost of each option and potential out of pocket expenses. After consideration of all the options, she declined NIPS and amniocentesis.    The patient had detailed ultrasound today.  The ultrasound report will be sent under separate cover. She understands that screening tests cannot rule out all birth defects or genetic syndromes. The patient was advised of this limitation and states she still does not want additional testing at this time.   [name] was provided with written information regarding cystic fibrosis (CF), spinal muscular atrophy (SMA) and hemoglobinopathies including the carrier frequency, availability of carrier screening and prenatal diagnosis if indicated.  In addition, we discussed that CF and hemoglobinopathies are routinely screened for as part of the [place] newborn screening panel.  She declined screening for CF, SMA and hemoglobinopathies.  Both family histories were reviewed and found to be noncontributory for birth defects, intellectual disability, and known genetic conditions. Without further information regarding the provided family history, an accurate genetic risk cannot be calculated. Further genetic counseling is warranted if  more information is obtained.  [name] denied exposure to  environmental toxins or chemical agents. She denied the use of alcohol, tobacco or street drugs. She denied significant viral illnesses during the course of her pregnancy. Her medical and surgical histories were noncontributory.   I counseled [name] regarding the above risks and available options.  The approximate face-to-face time with the genetic counselor was 30 minutes.  Quinn PlowmanKaren Add Dinapoli, MS,  Certified Genetic Counselor 07/09/2016

## 2016-07-10 ENCOUNTER — Other Ambulatory Visit (HOSPITAL_COMMUNITY): Payer: Self-pay | Admitting: *Deleted

## 2016-07-10 DIAGNOSIS — O09899 Supervision of other high risk pregnancies, unspecified trimester: Secondary | ICD-10-CM

## 2016-07-10 DIAGNOSIS — O09219 Supervision of pregnancy with history of pre-term labor, unspecified trimester: Principal | ICD-10-CM

## 2016-07-10 DIAGNOSIS — O09522 Supervision of elderly multigravida, second trimester: Secondary | ICD-10-CM

## 2016-07-15 ENCOUNTER — Encounter: Payer: Self-pay | Admitting: *Deleted

## 2016-07-15 ENCOUNTER — Ambulatory Visit (INDEPENDENT_AMBULATORY_CARE_PROVIDER_SITE_OTHER): Payer: Medicaid Other | Admitting: Obstetrics and Gynecology

## 2016-07-15 ENCOUNTER — Encounter: Payer: Self-pay | Admitting: Obstetrics and Gynecology

## 2016-07-15 VITALS — BP 118/63 | HR 84 | Wt 134.7 lb

## 2016-07-15 DIAGNOSIS — O09899 Supervision of other high risk pregnancies, unspecified trimester: Secondary | ICD-10-CM

## 2016-07-15 DIAGNOSIS — O09529 Supervision of elderly multigravida, unspecified trimester: Secondary | ICD-10-CM

## 2016-07-15 DIAGNOSIS — O0990 Supervision of high risk pregnancy, unspecified, unspecified trimester: Secondary | ICD-10-CM

## 2016-07-15 DIAGNOSIS — O09522 Supervision of elderly multigravida, second trimester: Secondary | ICD-10-CM

## 2016-07-15 DIAGNOSIS — R87612 Low grade squamous intraepithelial lesion on cytologic smear of cervix (LGSIL): Secondary | ICD-10-CM

## 2016-07-15 DIAGNOSIS — O09219 Supervision of pregnancy with history of pre-term labor, unspecified trimester: Secondary | ICD-10-CM

## 2016-07-15 DIAGNOSIS — Z98891 History of uterine scar from previous surgery: Secondary | ICD-10-CM

## 2016-07-15 DIAGNOSIS — O09212 Supervision of pregnancy with history of pre-term labor, second trimester: Secondary | ICD-10-CM | POA: Diagnosis not present

## 2016-07-15 DIAGNOSIS — O34219 Maternal care for unspecified type scar from previous cesarean delivery: Secondary | ICD-10-CM

## 2016-07-15 DIAGNOSIS — O0992 Supervision of high risk pregnancy, unspecified, second trimester: Secondary | ICD-10-CM

## 2016-07-15 DIAGNOSIS — Z789 Other specified health status: Secondary | ICD-10-CM | POA: Diagnosis not present

## 2016-07-15 DIAGNOSIS — Z758 Other problems related to medical facilities and other health care: Secondary | ICD-10-CM

## 2016-07-15 DIAGNOSIS — Z8759 Personal history of other complications of pregnancy, childbirth and the puerperium: Secondary | ICD-10-CM | POA: Diagnosis not present

## 2016-07-15 LAB — HM PAP SMEAR: Pap: ABNORMAL — AB

## 2016-07-15 LAB — GLUCOSE TOLERANCE, 1 HOUR: Glucose, 1 Hour GTT: 99

## 2016-07-16 DIAGNOSIS — Z8759 Personal history of other complications of pregnancy, childbirth and the puerperium: Secondary | ICD-10-CM | POA: Insufficient documentation

## 2016-07-16 DIAGNOSIS — R87612 Low grade squamous intraepithelial lesion on cytologic smear of cervix (LGSIL): Secondary | ICD-10-CM | POA: Insufficient documentation

## 2016-07-16 LAB — TSH: TSH: 1.63 u[IU]/mL (ref 0.450–4.500)

## 2016-07-16 LAB — COMPREHENSIVE METABOLIC PANEL
ALK PHOS: 64 IU/L (ref 39–117)
ALT: 15 IU/L (ref 0–32)
AST: 21 IU/L (ref 0–40)
Albumin/Globulin Ratio: 1.7 (ref 1.2–2.2)
Albumin: 3.6 g/dL (ref 3.5–5.5)
BUN/Creatinine Ratio: 15 (ref 9–23)
BUN: 7 mg/dL (ref 6–20)
Bilirubin Total: <0.2 mg/dL (ref 0.0–1.2)
CALCIUM: 9.3 mg/dL (ref 8.7–10.2)
CO2: 18 mmol/L — ABNORMAL LOW (ref 20–29)
Chloride: 104 mmol/L (ref 96–106)
Creatinine, Ser: 0.46 mg/dL — ABNORMAL LOW (ref 0.57–1.00)
GFR calc Af Amer: 145 mL/min/{1.73_m2} (ref >59)
GFR, EST NON AFRICAN AMERICAN: 126 mL/min/{1.73_m2} (ref >59)
GLOBULIN, TOTAL: 2.1 g/dL (ref 1.5–4.5)
Glucose: 117 mg/dL — ABNORMAL HIGH (ref 65–99)
POTASSIUM: 4 mmol/L (ref 3.5–5.2)
SODIUM: 141 mmol/L (ref 134–144)
Total Protein: 5.7 g/dL — ABNORMAL LOW (ref 6.0–8.5)

## 2016-07-16 LAB — PROTEIN / CREATININE RATIO, URINE
CREATININE, UR: 56.8 mg/dL
Protein, Ur: 6.3 mg/dL
Protein/Creat Ratio: 111 mg/g creat (ref 0–200)

## 2016-07-16 NOTE — Progress Notes (Signed)
New OB Note  07/15/2016   [facility]  Chief Complaint: NOB  Transfer of Care Patient: Yes, [facility]  History of Present Illness: [name] is a 41 y.o. [id number] @ 18/6 weeks (EDC 11/8, based on Patient's last menstrual period was 03/05/2016.=18wk u/s).  Preg complicated by has Advanced maternal age in multigravida; History of prior pregnancy with SGA newborn; Onychomycosis; [redacted] weeks gestation of pregnancy; History of preterm delivery, currently pregnant; History of VBAC; Language barrier; and Supervision of high risk pregnancy, antepartum on her problem list.   Any events prior to today's visit: no Her periods were: regular, qmonth She was using no method when she conceived.  She has Negative signs or symptoms of nausea/vomiting of pregnancy. She has Negative signs or symptoms of miscarriage or preterm labor On any different medications around the time she conceived/early pregnancy: No   ROS: A 12-point review of systems was performed and negative, except as stated in the above HPI.  OBGYN History: As per HPI. OB History  Gravida Para Term Preterm AB Living  3 2 1 1   2   SAB TAB Ectopic Multiple Live Births          2    # Outcome Date GA Lbr Len/2nd Weight Sex Delivery Anes PTL Lv  3 Current           2 Preterm 08/07/12 6242w4d 07:27 / 00:01 3 lb 7.3 oz (1.569 kg) F VBAC None Y LIV  1 Term 06/10/08 2047w0d 48:00 5 lb 4 oz (2.381 kg) M CS-Unspec   LIV     Birth Comments: Delivered [facility], Induced for suspected postdates, then found to be 37 weeks after delivery, c/s for failed induction /OP, delivered by Dr. Gaynell FaceMarshall     Any issues with any prior pregnancies: yes Prior children are healthy, doing well, and without any problems or issues: yes History of pap smears: Yes. Last pap smear 2018 and results were LSIL History of STIs: No   Past Medical History: Past Medical History:  Diagnosis Date  . GERD (gastroesophageal reflux disease)    When  pregnant only  . 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 CESAREAN SECTION  2010  . CHOLECYSTECTOMY  2010   Laparoscopic  . LEG SURGERY Right Age 465-41 years old   Due to osteomyelitis of right tibia    Family History:  No family history on file.  Social History:  Social History   Social History  . Marital status: Single    Spouse name: N/A  . Number of children: 2  . Years of education: 9   Occupational History  . Works at [employer]    Social History Main Topics  . Smoking status: Never Smoker  . Smokeless tobacco: Never Used  . Alcohol use No  . Drug use: No  . Sexual activity: Yes    Partners: Male    Birth control/ protection: None   Other Topics Concern  . Not on file   Social History Narrative   Originally [place]   [place]   Lives at home with her 547 yo son and 633 yo daughter   Father of children currently does not live with them.   He  is supportive    Allergy: No Known Allergies  Health Maintenance:  Mammogram Up to Date: not applicable  Current Outpatient Medications: PNV  Physical Exam:   BP 118/63   Pulse 84   Wt 134 lb 11.2 oz (61.1 kg)   LMP 03/05/2016   BMI 31.31 kg/m  Body mass index is 31.31 kg/m. Contractions: Not present Vag. Bleeding: None. Fundal height: not applicable FHTs: 140s  General appearance: Well nourished, well developed female in no acute distress.   Laboratory: [facility]: O pos/Rub imm/rpr neg/hiv neg/hepB surf ag neg/cf neg/gc-ct neg/ucx neg/1hr neg/UDS neg/LSIL pap/quad neg  Imaging:  6/7 u/s results reviewed  Assessment: pt doing well  Plan: 1. Supervision of high risk pregnancy, antepartum Routine care. Baseline pre-x labs. Pt told to start baby  ASA - Comprehensive metabolic panel - TSH - Protein / Creatinine Ratio, Urine  2. 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 History of preterm delivery, currently pregnant Set up for CLs already (1st one normal). Pt amenable to doing 17p  4. History of VBAC Counsel pt later in pregnancy  5. Language barrier Interpreter used  6. Elderly multigravida in second trimester Seen by mfm already. Declined anything except u/s and quad (neg)  7. History of prior pregnancy with SGA newborn Surveillance growth u/s starting at 24-28wks  8. H/o severe pre-x See above  Palmer Bing, MD

## 2016-07-22 ENCOUNTER — Ambulatory Visit (INDEPENDENT_AMBULATORY_CARE_PROVIDER_SITE_OTHER): Payer: Self-pay | Admitting: Internal Medicine

## 2016-07-22 ENCOUNTER — Encounter: Payer: Self-pay | Admitting: Internal Medicine

## 2016-07-22 VITALS — BP 118/70 | HR 70 | Resp 12 | Ht <= 58 in | Wt 135.0 lb

## 2016-07-22 DIAGNOSIS — J069 Acute upper respiratory infection, unspecified: Secondary | ICD-10-CM

## 2016-07-22 DIAGNOSIS — H6981 Other specified disorders of Eustachian tube, right ear: Secondary | ICD-10-CM

## 2016-07-22 MED ORDER — SALINE SPRAY 0.65 % NA SOLN: 1.0000 | NASAL | 0 refills | Status: DC | PRN

## 2016-07-22 MED ORDER — LORATADINE 10 MG PO TABS: 10.0000 mg | ORAL_TABLET | Freq: Every day | ORAL | 11 refills | Status: DC

## 2016-07-22 NOTE — Progress Notes (Signed)
   Subjective:    Patient ID: Monique Hutchinson, female    DOB: 04-09-1975, 41 y.o.   MRN: [id number]  HPI   Has had cold symptoms for 7 days and right ear pain for 3 days.  Nasal and facial congestion. Nasal drainage is yellow.  Throat is dry, but not painful.  No one else at home or at work is ill.   No fever.  Not sneezing much.   Not much in way of cough No chest congestion.  No dyspnea.  Has only taken Tylenol, but not with cold remedy ingredients. She is 4 months pregnant.   Current Meds  Medication Sig  . acetaminophen (TYLENOL) 325 MG tablet Take 650 mg by mouth every 6 (six) hours as needed.  . prenatal vitamin w/FE, FA (PRENATAL 1 + 1) 27-1 MG TABS tablet 1 tab by mouth daily    No Known Allergies    Review of Systems     Objective:   Physical Exam   NAD Nasally congested HEENT; PERRL , EOMI, TMs dull, but without injection.  Right TM a bit retracted.  Throat without injection MMM. Neck:  Supple, No adenopathy Chest:  CTA CV:  RRR without murmur or rub, radial pulses normal and equal.         Assessment & Plan:  URI with right eustachian tube dysfunction:  Loratadine 10 mg daily with saline nasal spray to treat symptomatically. Call by end of week if symptoms not improving at all or worsening and will contemplate antibiotics.

## 2016-07-22 NOTE — Patient Instructions (Signed)
Call by 3 pm on Friday if absolutely no improvement or if worsening.

## 2016-07-24 MED ORDER — AMOXICILLIN 500 MG PO CAPS: 500.0000 mg | ORAL_CAPSULE | Freq: Three times a day (TID) | ORAL | 0 refills | Status: DC

## 2016-07-24 NOTE — Telephone Encounter (Signed)
Per Dr. Delrae AlfredMulberry patient can have amoxicillin 500 mg 1 three times a day for 10 days. Patient informed and Rx [facility] [address].

## 2016-07-24 NOTE — Telephone Encounter (Signed)
Patient called stating symptoms are worse and face bones are hurting so bad; plugged nose; when trying to blow it out mucus comes with blood and now is having headache.  Patient stated was told to call the clinic is symptoms didn't improve.

## 2016-07-27 ENCOUNTER — Telehealth: Payer: Self-pay | Admitting: *Deleted

## 2016-07-27 NOTE — Telephone Encounter (Signed)
Patient's 17-P has arrived. Attempted to call patient using Spanish interpreter # [phone]. Patient did not answer but voice mail was left stating I am calling to let her know her medication is here and to call back to schedule her first injection.

## 2016-07-28 ENCOUNTER — Other Ambulatory Visit (HOSPITAL_COMMUNITY): Payer: Self-pay | Admitting: Obstetrics and Gynecology

## 2016-07-28 ENCOUNTER — Ambulatory Visit (INDEPENDENT_AMBULATORY_CARE_PROVIDER_SITE_OTHER): Payer: Medicaid Other

## 2016-07-28 ENCOUNTER — Encounter (HOSPITAL_COMMUNITY): Payer: Self-pay

## 2016-07-28 ENCOUNTER — Ambulatory Visit (HOSPITAL_COMMUNITY)
Admission: RE | Admit: 2016-07-28 | Discharge: 2016-07-28 | Disposition: A | Discharge status: Home / Self Care | Payer: Self-pay | Source: Ambulatory Visit | Attending: Nurse Practitioner | Admitting: Nurse Practitioner

## 2016-07-28 VITALS — BP 119/45 | HR 62

## 2016-07-28 DIAGNOSIS — O09219 Supervision of pregnancy with history of pre-term labor, unspecified trimester: Secondary | ICD-10-CM

## 2016-07-28 DIAGNOSIS — Z3A20 20 weeks gestation of pregnancy: Secondary | ICD-10-CM | POA: Insufficient documentation

## 2016-07-28 DIAGNOSIS — O09522 Supervision of elderly multigravida, second trimester: Secondary | ICD-10-CM | POA: Insufficient documentation

## 2016-07-28 DIAGNOSIS — O99212 Obesity complicating pregnancy, second trimester: Secondary | ICD-10-CM | POA: Insufficient documentation

## 2016-07-28 DIAGNOSIS — Z6830 Body mass index (BMI) 30.0-30.9, adult: Secondary | ICD-10-CM | POA: Insufficient documentation

## 2016-07-28 DIAGNOSIS — O34219 Maternal care for unspecified type scar from previous cesarean delivery: Secondary | ICD-10-CM | POA: Insufficient documentation

## 2016-07-28 DIAGNOSIS — O09299 Supervision of pregnancy with other poor reproductive or obstetric history, unspecified trimester: Secondary | ICD-10-CM

## 2016-07-28 DIAGNOSIS — O09212 Supervision of pregnancy with history of pre-term labor, second trimester: Secondary | ICD-10-CM

## 2016-07-28 DIAGNOSIS — E669 Obesity, unspecified: Secondary | ICD-10-CM | POA: Insufficient documentation

## 2016-07-28 DIAGNOSIS — O09899 Supervision of other high risk pregnancies, unspecified trimester: Secondary | ICD-10-CM

## 2016-07-28 DIAGNOSIS — O321XX0 Maternal care for breech presentation, not applicable or unspecified: Secondary | ICD-10-CM | POA: Insufficient documentation

## 2016-07-28 DIAGNOSIS — O09892 Supervision of other high risk pregnancies, second trimester: Secondary | ICD-10-CM | POA: Insufficient documentation

## 2016-07-28 MED ORDER — HYDROXYPROGESTERONE CAPROATE 250 MG/ML IM OIL
250.0000 mg | TOPICAL_OIL | Freq: Once | INTRAMUSCULAR | Status: AC
  Administered 2016-07-28: 250 mg via INTRAMUSCULAR

## 2016-07-28 NOTE — Progress Notes (Signed)
17-P INJECTION

## 2016-08-04 ENCOUNTER — Ambulatory Visit (INDEPENDENT_AMBULATORY_CARE_PROVIDER_SITE_OTHER): Payer: Self-pay | Admitting: *Deleted

## 2016-08-04 VITALS — BP 110/58 | HR 72

## 2016-08-04 DIAGNOSIS — O09899 Supervision of other high risk pregnancies, unspecified trimester: Secondary | ICD-10-CM

## 2016-08-04 DIAGNOSIS — O09212 Supervision of pregnancy with history of pre-term labor, second trimester: Secondary | ICD-10-CM

## 2016-08-04 DIAGNOSIS — O09219 Supervision of pregnancy with history of pre-term labor, unspecified trimester: Principal | ICD-10-CM

## 2016-08-04 MED ORDER — HYDROXYPROGESTERONE CAPROATE 250 MG/ML IM OIL
250.0000 mg | TOPICAL_OIL | Freq: Once | INTRAMUSCULAR | Status: AC
  Administered 2016-08-04: 250 mg via INTRAMUSCULAR

## 2016-08-11 ENCOUNTER — Ambulatory Visit (HOSPITAL_COMMUNITY)
Admission: RE | Admit: 2016-08-11 | Discharge: 2016-08-11 | Disposition: A | Discharge status: Home / Self Care | Payer: Self-pay | Source: Ambulatory Visit | Attending: Nurse Practitioner | Admitting: Nurse Practitioner

## 2016-08-11 ENCOUNTER — Other Ambulatory Visit (HOSPITAL_COMMUNITY): Payer: Self-pay | Admitting: Obstetrics and Gynecology

## 2016-08-11 ENCOUNTER — Other Ambulatory Visit (HOSPITAL_COMMUNITY): Payer: Self-pay | Admitting: *Deleted

## 2016-08-11 ENCOUNTER — Encounter (HOSPITAL_COMMUNITY): Payer: Self-pay

## 2016-08-11 ENCOUNTER — Ambulatory Visit (INDEPENDENT_AMBULATORY_CARE_PROVIDER_SITE_OTHER): Payer: Self-pay | Admitting: Advanced Practice Midwife

## 2016-08-11 VITALS — BP 116/61 | HR 61 | Wt 135.8 lb

## 2016-08-11 DIAGNOSIS — Z3A22 22 weeks gestation of pregnancy: Secondary | ICD-10-CM

## 2016-08-11 DIAGNOSIS — O09899 Supervision of other high risk pregnancies, unspecified trimester: Secondary | ICD-10-CM

## 2016-08-11 DIAGNOSIS — Z8759 Personal history of other complications of pregnancy, childbirth and the puerperium: Secondary | ICD-10-CM

## 2016-08-11 DIAGNOSIS — O0990 Supervision of high risk pregnancy, unspecified, unspecified trimester: Secondary | ICD-10-CM

## 2016-08-11 DIAGNOSIS — O09522 Supervision of elderly multigravida, second trimester: Secondary | ICD-10-CM

## 2016-08-11 DIAGNOSIS — O0992 Supervision of high risk pregnancy, unspecified, second trimester: Secondary | ICD-10-CM

## 2016-08-11 DIAGNOSIS — O09219 Supervision of pregnancy with history of pre-term labor, unspecified trimester: Secondary | ICD-10-CM | POA: Insufficient documentation

## 2016-08-11 DIAGNOSIS — O34219 Maternal care for unspecified type scar from previous cesarean delivery: Secondary | ICD-10-CM

## 2016-08-11 DIAGNOSIS — O09212 Supervision of pregnancy with history of pre-term labor, second trimester: Secondary | ICD-10-CM

## 2016-08-11 DIAGNOSIS — O99212 Obesity complicating pregnancy, second trimester: Secondary | ICD-10-CM

## 2016-08-11 DIAGNOSIS — Z8751 Personal history of pre-term labor: Secondary | ICD-10-CM

## 2016-08-11 DIAGNOSIS — Z362 Encounter for other antenatal screening follow-up: Secondary | ICD-10-CM

## 2016-08-11 DIAGNOSIS — O09299 Supervision of pregnancy with other poor reproductive or obstetric history, unspecified trimester: Secondary | ICD-10-CM | POA: Insufficient documentation

## 2016-08-11 DIAGNOSIS — Z98891 History of uterine scar from previous surgery: Secondary | ICD-10-CM

## 2016-08-11 MED ORDER — HYDROXYPROGESTERONE CAPROATE 250 MG/ML IM OIL
250.0000 mg | TOPICAL_OIL | INTRAMUSCULAR | Status: AC
  Administered 2016-08-11 – 2016-11-10 (×14): 250 mg via INTRAMUSCULAR

## 2016-08-11 NOTE — Addendum Note (Signed)
Addended by: Faythe CasaBELLAMY, Henrine Hayter M on: 08/11/2016 09:54 AM   Modules accepted: Orders

## 2016-08-11 NOTE — Progress Notes (Signed)
   PRENATAL VISIT NOTE  Subjective:  Monique Hutchinson is a 41 y.o. G3P1102 at 6258w5d being seen today for ongoing prenatal care.  She is currently monitored for the following issues for this high-risk pregnancy and has Advanced maternal age in multigravida; History of prior pregnancy with SGA newborn; Onychomycosis; History of preterm delivery, currently pregnant; History of VBAC; Language barrier; Supervision of high risk pregnancy, antepartum; Low grade squamous intraepith lesion on cytologic smear cervix (lgsil); and History of severe pre-eclampsia on her problem list.  Patient reports no complaints.  Contractions: Not present. Vag. Bleeding: None.  Movement: Present. Denies leaking of fluid.   The following portions of the patient's history were reviewed and updated as appropriate: allergies, current medications, past family history, past medical history, past social history, past surgical history and problem list. Problem list updated.  Objective:   Vitals:   08/11/16 0812  BP: 116/61  Pulse: 61  Weight: 135 lb 12.8 oz (61.6 kg)    Fetal Status: Fetal Heart Rate (bpm): 147 Fundal Height: 22 cm Movement: Present     General:  Alert, oriented and cooperative. Patient is in no acute distress.  Skin: Skin is warm and dry. No rash noted.   Cardiovascular: Normal heart rate noted  Respiratory: Normal respiratory effort, no problems with respiration noted  Abdomen: Soft, gravid, appropriate for gestational age. Pain/Pressure: Present     Pelvic:  Cervical exam deferred        Extremities: Normal range of motion.  Edema: None  Mental Status: Normal mood and affect. Normal behavior. Normal judgment and thought content.   Assessment and Plan:  Pregnancy: [id number] at 3458w5d  There are no diagnoses linked to this encounter. Preterm labor symptoms and general obstetric precautions including but not limited to vaginal bleeding, contractions, leaking of fluid and fetal movement were reviewed  in detail with the patient. Please refer to After Visit Summary for other counseling recommendations.  Return in about 4 weeks (around 09/08/2016) [facility], continue weekly 17-P. US for growth and Cl today.  GTT ar NV  Dorathy KinsmanVirginia Kaiesha Tonner, CNM

## 2016-08-11 NOTE — Patient Instructions (Signed)
Información sobre parto y trabajo de parto prematuros °(Preterm Labor and Birth Information) °La duración de un embarazo normal es de 39 a 41 semanas. Se llama trabajo de parto prematuro cuando se inicia antes de las 37 semanas de embarazo. °¿CUÁLES SON LOS FACTORES DE RIESGO DEL TRABAJO DE PARTO PREMATURO? °Existen mayores probabilidades de trabajo de parto prematuro en mujeres con las siguientes características: °· Tienen ciertas infecciones durante el embarazo, como infección de vejiga, infección de transmisión sexual o infección en el útero (corioamnionitis). °· Tienen el cuello del útero más corto que lo normal. °· Tuvieron trabajo de parto prematuro anteriormente. °· Se sometieron a una cirugía en el cuello del útero. °· Son menores de 17 años o mayores de 35 años de edad. °· Son afroamericanas. °· Están embarazadas de mellizos o de varios bebés (gestación múltiple). °· Consumen drogas o fuman mientras están embarazadas. °· No aumentan de peso lo suficiente durante el embarazo. °· Se embarazan poco después de haber estado embarazadas. °¿CUÁLES SON LOS SÍNTOMAS DEL TRABAJO DE PARTO PREMATURO? °Los síntomas del trabajo de parto prematuro incluyen lo siguiente: °· Calambres similares a los que ocurren durante el período menstrual. Los calambres pueden presentarse con diarrea. °· Dolor en el abdomen o en la parte inferior de la espalda. °· Contracciones uterinas regulares que se pueden sentir como una presión en el abdomen. °· Una sensación de mayor presión en la pelvis. °· Aumento de la secreción de moco acuoso o sanguinolento en la vagina. °· Rotura de bolsa (rotura de saco amniótico). °¿POR QUÉ ES IMPORTANTE RECONOCER LOS SIGNOS DEL TRABAJO DE PARTO PREMATURO? °Es importante reconocer los signos del trabajo de parto prematuro porque los bebés que nacen de forma prematura pueden no estar completamente desarrollados. Por lo tanto, pueden correr mayor riesgo de lo siguiente: °· Problemas cardíacos y pulmonares a  largo plazo (crónicos). °· Inmediatamente después del parto, dificultades para regular los sistemas corporales, que incluyen glucemia, temperatura corporal, frecuencia cardíaca y frecuencia respiratoria. °· Hemorragia cerebral. °· Parálisis cerebral. °· Dificultades en el aprendizaje. °· Muerte. °Estos riesgos son mucho mayores para bebés que nacen antes de las 34 semanas de embarazo. °¿CÓMO SE TRATA EL TRABAJO DE PARTO PREMATURO? °El tratamiento depende del tiempo de su embarazo, su afección y la salud de su bebé. Puede incluir lo siguiente: °· Tener un punto (sutura) en el cuello del útero para evitar que este se abra demasiado pronto (cerclaje). °· Tomar medicamentos, por ejemplo: °? Medicamentos hormonales. Estos se pueden administrar de forma temprana en el embarazo para ayudar a mantener el embarazo. °? Medicamentos para detener las contracciones. °? Medicamentos que ayudan a madurar los pulmones del bebé. Estos se pueden recetar si el riesgo de parto es alto. °? Medicamentos para evitar que el bebé desarrolle parálisis cerebral. °Si el trabajo de parto de inicia antes de las 34 semanas de embarazo, es posible que deba hospitalizarse. °¿QUÉ DEBO HACER SI CREO QUE ESTOY EN TRABAJO DE PARTO PREMATURO? °Si cree que está iniciando trabajo de parto prematuro, llame al médico de inmediato. °¿CÓMO PUEDO EVITAR EL TRABAJO DE PARTO PREMATURO EN FUTUROS EMBARAZOS? °Para aumentar las probabilidades de tener un embarazo a término, tenga en cuenta lo siguiente: °· No consuma ningún producto que contenga tabaco, lo que incluye cigarrillos, tabaco de mascar y cigarrillos electrónicos. Si necesita ayuda para dejar de fumar, consulte al médico. °· No consuma drogas ni medicamentos que no sean recetados durante el embarazo. °· Hable con el médico antes de tomar suplementos a base de hierbas aunque los haya estado tomando   periódicamente. °· Asegúrese de llegar a un peso saludable durante el embarazo. °· Tenga cuidado con las  infecciones. Si cree que puede tener una infección, consulte al médico para que la revisen. °· Asegúrese de informarle al médico si ha tenido trabajo de parto prematuro antes. °Esta información no tiene como fin reemplazar el consejo del médico. Asegúrese de hacerle al médico cualquier pregunta que tenga. °Document Released: 04/28/2007 Document Revised: 09/21/2012 Document Reviewed: 06/12/2015 °Elsevier Interactive Patient Education © 2018 Elsevier Inc. ° °

## 2016-08-11 NOTE — Progress Notes (Signed)
Video Interpreter # [id number] 

## 2016-08-18 ENCOUNTER — Other Ambulatory Visit: Payer: Self-pay

## 2016-08-18 ENCOUNTER — Ambulatory Visit (INDEPENDENT_AMBULATORY_CARE_PROVIDER_SITE_OTHER): Payer: Self-pay | Admitting: General Practice

## 2016-08-18 DIAGNOSIS — O09899 Supervision of other high risk pregnancies, unspecified trimester: Secondary | ICD-10-CM

## 2016-08-18 DIAGNOSIS — O09219 Supervision of pregnancy with history of pre-term labor, unspecified trimester: Principal | ICD-10-CM

## 2016-08-18 DIAGNOSIS — O09212 Supervision of pregnancy with history of pre-term labor, second trimester: Secondary | ICD-10-CM

## 2016-08-18 NOTE — Progress Notes (Signed)
Tommy Medalhiquita Wilson,CMA saw patient for visit today & gave injection

## 2016-08-25 ENCOUNTER — Ambulatory Visit (HOSPITAL_COMMUNITY): Admission: RE | Admit: 2016-08-25 | Payer: Self-pay | Source: Ambulatory Visit

## 2016-08-25 ENCOUNTER — Ambulatory Visit (INDEPENDENT_AMBULATORY_CARE_PROVIDER_SITE_OTHER): Payer: Self-pay | Admitting: General Practice

## 2016-08-25 VITALS — BP 108/71 | HR 66 | Ht <= 58 in | Wt 135.8 lb

## 2016-08-25 DIAGNOSIS — O09212 Supervision of pregnancy with history of pre-term labor, second trimester: Secondary | ICD-10-CM

## 2016-08-25 DIAGNOSIS — O09899 Supervision of other high risk pregnancies, unspecified trimester: Secondary | ICD-10-CM

## 2016-08-25 DIAGNOSIS — O09219 Supervision of pregnancy with history of pre-term labor, unspecified trimester: Principal | ICD-10-CM

## 2016-09-01 ENCOUNTER — Ambulatory Visit: Payer: Self-pay

## 2016-09-02 ENCOUNTER — Other Ambulatory Visit (HOSPITAL_COMMUNITY): Payer: Self-pay | Admitting: Maternal and Fetal Medicine

## 2016-09-02 ENCOUNTER — Ambulatory Visit (INDEPENDENT_AMBULATORY_CARE_PROVIDER_SITE_OTHER): Payer: Self-pay | Admitting: *Deleted

## 2016-09-02 ENCOUNTER — Ambulatory Visit (HOSPITAL_COMMUNITY)
Admission: RE | Admit: 2016-09-02 | Discharge: 2016-09-02 | Disposition: A | Discharge status: Home / Self Care | Payer: Self-pay | Source: Ambulatory Visit | Attending: Maternal and Fetal Medicine | Admitting: Maternal and Fetal Medicine

## 2016-09-02 ENCOUNTER — Encounter (HOSPITAL_COMMUNITY): Payer: Self-pay

## 2016-09-02 DIAGNOSIS — O34219 Maternal care for unspecified type scar from previous cesarean delivery: Secondary | ICD-10-CM

## 2016-09-02 DIAGNOSIS — O09292 Supervision of pregnancy with other poor reproductive or obstetric history, second trimester: Secondary | ICD-10-CM

## 2016-09-02 DIAGNOSIS — O99212 Obesity complicating pregnancy, second trimester: Secondary | ICD-10-CM

## 2016-09-02 DIAGNOSIS — Z8751 Personal history of pre-term labor: Secondary | ICD-10-CM

## 2016-09-02 DIAGNOSIS — O09892 Supervision of other high risk pregnancies, second trimester: Secondary | ICD-10-CM

## 2016-09-02 DIAGNOSIS — Z3A25 25 weeks gestation of pregnancy: Secondary | ICD-10-CM

## 2016-09-02 DIAGNOSIS — O0990 Supervision of high risk pregnancy, unspecified, unspecified trimester: Secondary | ICD-10-CM

## 2016-09-02 DIAGNOSIS — O09212 Supervision of pregnancy with history of pre-term labor, second trimester: Secondary | ICD-10-CM

## 2016-09-02 DIAGNOSIS — Z6830 Body mass index (BMI) 30.0-30.9, adult: Secondary | ICD-10-CM | POA: Insufficient documentation

## 2016-09-02 DIAGNOSIS — O09522 Supervision of elderly multigravida, second trimester: Secondary | ICD-10-CM

## 2016-09-08 ENCOUNTER — Ambulatory Visit (INDEPENDENT_AMBULATORY_CARE_PROVIDER_SITE_OTHER): Payer: Self-pay | Admitting: Obstetrics and Gynecology

## 2016-09-08 VITALS — BP 116/72 | HR 66 | Wt 138.1 lb

## 2016-09-08 DIAGNOSIS — O0992 Supervision of high risk pregnancy, unspecified, second trimester: Secondary | ICD-10-CM

## 2016-09-08 DIAGNOSIS — Z789 Other specified health status: Secondary | ICD-10-CM

## 2016-09-08 DIAGNOSIS — O0990 Supervision of high risk pregnancy, unspecified, unspecified trimester: Secondary | ICD-10-CM

## 2016-09-08 DIAGNOSIS — O09219 Supervision of pregnancy with history of pre-term labor, unspecified trimester: Secondary | ICD-10-CM

## 2016-09-08 DIAGNOSIS — O09899 Supervision of other high risk pregnancies, unspecified trimester: Secondary | ICD-10-CM

## 2016-09-08 DIAGNOSIS — Z23 Encounter for immunization: Secondary | ICD-10-CM

## 2016-09-08 DIAGNOSIS — O09522 Supervision of elderly multigravida, second trimester: Secondary | ICD-10-CM

## 2016-09-08 DIAGNOSIS — O09212 Supervision of pregnancy with history of pre-term labor, second trimester: Secondary | ICD-10-CM

## 2016-09-08 LAB — POCT URINALYSIS DIP (DEVICE)
Bilirubin Urine: NEGATIVE
GLUCOSE, UA: NEGATIVE mg/dL
Hgb urine dipstick: NEGATIVE
Ketones, ur: NEGATIVE mg/dL
LEUKOCYTES UA: NEGATIVE
Nitrite: NEGATIVE
PROTEIN: NEGATIVE mg/dL
SPECIFIC GRAVITY, URINE: 1.015 (ref 1.005–1.030)
UROBILINOGEN UA: 0.2 mg/dL (ref 0.0–1.0)
pH: 7 (ref 5.0–8.0)

## 2016-09-08 NOTE — Patient Instructions (Signed)
Informacin sobre el parto prematuro  (Preterm Labor Information)  Se llama parto prematuro cuando se inicia antes de las 37 semanas de embarazo. La duracin de un embarazo normal es de 39 a 41 semanas.  CAUSAS  Generalmente las causas del parto prematuro no se conocen. La causa ms frecuente conocida es una infeccin.  FACTORES DE RIESGO   Historia previa de parto prematuro.  Romper la bolsa de aguas antes de tiempo.  La placenta cubre la abertura del cuello.  La placenta se despega del tero.  El cuello es demasiado dbil para contener al beb en el tero.  Hay mucho lquido en el saco amnitico.  Consumo de drogas o hbito de fumar durante el embarazo.  No aumentar de peso lo suficiente durante el embarazo.  Mujeres menores de 18 aos o mayores de 35 aos.  Tener bajos ingresos.  Pertenecer a la raza afroamericana. SNTOMAS   Clicos similares a los menstruales, dolor en el vientre (abdominal) o dolor en la espalda.  Contracciones regulares, tan frecuentes como seis en una hora. Pueden ser suaves o dolorosas.  Contracciones que comienzan en la parte superior del vientre. Luego bajan hacia la zona inferior del vientre y hacia la espalda.  Presin en la zona inferior del vientre que parece empeorar.  Sangrado que proviene de la vagina.  Prdida de lquido por la vagina. TRATAMIENTO  El tratamiento depende de:   Su estado.  El estado del beb.  Cuntas semanas tiene de embarazo. El mdico podr indicarle:   Medicamentos para detener las contracciones.  Que permanezca en la cama excepto para ir al bao (reposo en cama).  Que permanezca en el hospital. QU DEBE HACER SI PIENSA QUE EST EN TRABAJO DE PARTO PREMATURO?  Comunquese con su mdico de inmediato. Debe concurrir al hospital para ser controlada inmediatamente.  CMO PUEDE EVITAR EL TRABAJO DE PARTO PREMATURO EN FUTUROS EMBARAZOS?   Si fuma, abandone el hbito.  Mantenga un aumento de peso  saludable.  Notome drogas ni manipule sustancias qumicas que no necesita.  Informe a su mdico si piensa que tiene una infeccin.  Informe a su mdico si tuvo un trabajo de parto prematuro anteriormente. Esta informacin no tiene como fin reemplazar el consejo del mdico. Asegrese de hacerle al mdico cualquier pregunta que tenga. Document Released: 02/21/2010 Document Revised: 09/21/2012 Elsevier Interactive Patient Education  2017 Elsevier Inc.  

## 2016-09-08 NOTE — Progress Notes (Signed)
Subjective:  Monique Hutchinson is a 41 y.o. G3P1102 at 236w5d being seen today for ongoing prenatal care.  She is currently monitored for the following issues for this high-risk pregnancy and has Advanced maternal age in multigravida; History of prior pregnancy with SGA newborn; Onychomycosis; History of preterm delivery, currently pregnant; History of VBAC; Language barrier; Supervision of high risk pregnancy, antepartum; Low grade squamous intraepith lesion on cytologic smear cervix (lgsil); and History of severe pre-eclampsia on her problem list.  Patient reports no complaints.  Contractions: Not present. Vag. Bleeding: None.  Movement: Present. Denies leaking of fluid.   The following portions of the patient's history were reviewed and updated as appropriate: allergies, current medications, past family history, past medical history, past social history, past surgical history and problem list. Problem list updated.  Objective:   Vitals:   09/08/16 0812  BP: 116/72  Pulse: 66  Weight: 138 lb 1.6 oz (62.6 kg)    Fetal Status: Fetal Heart Rate (bpm): 130   Movement: Present     General:  Alert, oriented and cooperative. Patient is in no acute distress.  Skin: Skin is warm and dry. No rash noted.   Cardiovascular: Normal heart rate noted  Respiratory: Normal respiratory effort, no problems with respiration noted  Abdomen: Soft, gravid, appropriate for gestational age. Pain/Pressure: Absent     Pelvic: Vag. Bleeding: None     Cervical exam deferred        Extremities: Normal range of motion.  Edema: Trace  Mental Status: Normal mood and affect. Normal behavior. Normal judgment and thought content.    Assessment and Plan:  Pregnancy: G3P1102 at 636w5d  1. Supervision of high risk pregnancy, antepartum [place] routine prenatal care.  - Tdap vaccine greater than or equal to 7yo IM - Glucose Tolerance, 2 Hours w/1 Hour - CBC - RPR  2. Language barrier Video interpretor used for  encounter  3. History of preterm delivery, currently pregnant 17P today. Continue weekly. Most recent US for cervical length normal and they recommend no more testing.   4. Elderly multigravida in second trimester Normal anatomic US. Had negative Quad screen this pregnancy. Declined further testing.   Preterm labor symptoms and general obstetric precautions including but not limited to vaginal bleeding, contractions, leaking of fluid and fetal movement were reviewed in detail with the patient. Please refer to After Visit Summary for other counseling recommendations.  Return in about 2 weeks (around 09/22/2016) for ob visit.   Caryl AdaJazma Jahmya Onofrio, DO OB Fellow Faculty Practice, [facility] 09/08/2016, 9:04 AM

## 2016-09-09 ENCOUNTER — Telehealth: Payer: Self-pay | Admitting: General Practice

## 2016-09-09 ENCOUNTER — Encounter: Payer: Self-pay | Admitting: Obstetrics and Gynecology

## 2016-09-09 DIAGNOSIS — O24419 Gestational diabetes mellitus in pregnancy, unspecified control: Secondary | ICD-10-CM | POA: Insufficient documentation

## 2016-09-09 LAB — CBC
Hematocrit: 38.2 % (ref 34.0–46.6)
Hemoglobin: 12.6 g/dL (ref 11.1–15.9)
MCH: 30.4 pg (ref 26.6–33.0)
MCHC: 33 g/dL (ref 31.5–35.7)
MCV: 92 fL (ref 79–97)
PLATELETS: 246 10*3/uL (ref 150–379)
RBC: 4.15 x10E6/uL (ref 3.77–5.28)
RDW: 14.4 % (ref 12.3–15.4)
WBC: 9.1 10*3/uL (ref 3.4–10.8)

## 2016-09-09 LAB — RPR: RPR: NONREACTIVE

## 2016-09-09 LAB — GLUCOSE TOLERANCE, 2 HOURS W/ 1HR
GLUCOSE, 1 HOUR: 182 mg/dL — AB (ref 65–179)
GLUCOSE, 2 HOUR: 122 mg/dL (ref 65–152)
GLUCOSE, FASTING: 90 mg/dL (ref 65–91)

## 2016-09-09 LAB — HIV ANTIBODY (ROUTINE TESTING W REFLEX): HIV Screen 4th Generation wRfx: NONREACTIVE

## 2016-09-09 NOTE — Telephone Encounter (Signed)
Called patient with [employer] for interpreter, no answer- left message to call us back concerning results/an appt.

## 2016-09-09 NOTE — Telephone Encounter (Signed)
-----   Message from Pincus LargeJazma Y Phelps, DO sent at 09/09/2016  8:56 AM EDT ----- Patient failed 2hr GTT. Please inform her of these results and set her up per protocol for HROB and nutrition consultation.   Thank you!

## 2016-09-15 ENCOUNTER — Ambulatory Visit (INDEPENDENT_AMBULATORY_CARE_PROVIDER_SITE_OTHER): Payer: Self-pay | Admitting: *Deleted

## 2016-09-15 VITALS — BP 106/65 | HR 76 | Wt 137.8 lb

## 2016-09-15 DIAGNOSIS — O09219 Supervision of pregnancy with history of pre-term labor, unspecified trimester: Principal | ICD-10-CM

## 2016-09-15 DIAGNOSIS — O09212 Supervision of pregnancy with history of pre-term labor, second trimester: Secondary | ICD-10-CM

## 2016-09-15 DIAGNOSIS — O09899 Supervision of other high risk pregnancies, unspecified trimester: Secondary | ICD-10-CM

## 2016-09-15 NOTE — Progress Notes (Signed)
Makena 250 mg administered as scheduled. Pt tolerated well.  Next scheduled 8/22.

## 2016-09-21 NOTE — Telephone Encounter (Addendum)
Called Monique Hutchinson with Interpreter [employer] and left a message we are calling with some information- please call our office back to discuss. Also put a note on her visit note for 09/23/16 to tell pt about GDM, and needs education appt.

## 2016-09-21 NOTE — Telephone Encounter (Signed)
Scheduled patient to see diabetes ed 8/23 @ 8am. Called patient with pacific interpreter # [phone], no answer- left message to call us back concerning results/appt. Called emergency contact number and her husband answered stating she wasn't in right now. Told him to have patient call us as we are trying to reach her with results/an appt for Thursday morning. He verbalized understanding & had no questions

## 2016-09-22 ENCOUNTER — Ambulatory Visit (HOSPITAL_COMMUNITY)
Admission: RE | Admit: 2016-09-22 | Discharge: 2016-09-22 | Disposition: A | Discharge status: Home / Self Care | Payer: Self-pay | Source: Ambulatory Visit | Attending: Advanced Practice Midwife | Admitting: Advanced Practice Midwife

## 2016-09-22 ENCOUNTER — Encounter (HOSPITAL_COMMUNITY): Payer: Self-pay

## 2016-09-22 ENCOUNTER — Other Ambulatory Visit (HOSPITAL_COMMUNITY): Payer: Self-pay | Admitting: Maternal and Fetal Medicine

## 2016-09-22 ENCOUNTER — Other Ambulatory Visit (HOSPITAL_COMMUNITY): Payer: Self-pay | Admitting: *Deleted

## 2016-09-22 DIAGNOSIS — O09523 Supervision of elderly multigravida, third trimester: Secondary | ICD-10-CM

## 2016-09-22 DIAGNOSIS — O34219 Maternal care for unspecified type scar from previous cesarean delivery: Secondary | ICD-10-CM

## 2016-09-22 DIAGNOSIS — Z6830 Body mass index (BMI) 30.0-30.9, adult: Secondary | ICD-10-CM | POA: Insufficient documentation

## 2016-09-22 DIAGNOSIS — O09219 Supervision of pregnancy with history of pre-term labor, unspecified trimester: Secondary | ICD-10-CM

## 2016-09-22 DIAGNOSIS — O09529 Supervision of elderly multigravida, unspecified trimester: Secondary | ICD-10-CM

## 2016-09-22 DIAGNOSIS — Z3A28 28 weeks gestation of pregnancy: Secondary | ICD-10-CM

## 2016-09-22 DIAGNOSIS — O09299 Supervision of pregnancy with other poor reproductive or obstetric history, unspecified trimester: Secondary | ICD-10-CM | POA: Insufficient documentation

## 2016-09-22 DIAGNOSIS — O09522 Supervision of elderly multigravida, second trimester: Secondary | ICD-10-CM

## 2016-09-22 DIAGNOSIS — Z8751 Personal history of pre-term labor: Secondary | ICD-10-CM

## 2016-09-22 DIAGNOSIS — O0990 Supervision of high risk pregnancy, unspecified, unspecified trimester: Secondary | ICD-10-CM

## 2016-09-22 DIAGNOSIS — O99213 Obesity complicating pregnancy, third trimester: Secondary | ICD-10-CM

## 2016-09-23 ENCOUNTER — Ambulatory Visit (INDEPENDENT_AMBULATORY_CARE_PROVIDER_SITE_OTHER): Payer: Self-pay | Admitting: Obstetrics & Gynecology

## 2016-09-23 VITALS — BP 126/73 | HR 80 | Wt 139.6 lb

## 2016-09-23 DIAGNOSIS — O0993 Supervision of high risk pregnancy, unspecified, third trimester: Secondary | ICD-10-CM

## 2016-09-23 DIAGNOSIS — O24419 Gestational diabetes mellitus in pregnancy, unspecified control: Secondary | ICD-10-CM

## 2016-09-23 DIAGNOSIS — O09213 Supervision of pregnancy with history of pre-term labor, third trimester: Secondary | ICD-10-CM

## 2016-09-23 DIAGNOSIS — O0990 Supervision of high risk pregnancy, unspecified, unspecified trimester: Secondary | ICD-10-CM

## 2016-09-23 NOTE — Progress Notes (Signed)
   PRENATAL VISIT NOTE  Subjective:  Monique Hutchinson is a 41 y.o. G3P1102 at [redacted]w[redacted]d being seen today for ongoing prenatal care.  She is currently monitored for the following issues for this high-risk pregnancy and has Advanced maternal age in multigravida; History of prior pregnancy with SGA newborn; Onychomycosis; History of preterm delivery, currently pregnant; History of VBAC; Language barrier; Supervision of high risk pregnancy, antepartum; Low grade squamous intraepith lesion on cytologic smear cervix (lgsil); History of severe pre-eclampsia; and GDM (gestational diabetes mellitus) on her problem list.  Patient reports no complaints.  Contractions: Not present.  .  Movement: Present. Denies leaking of fluid.   The following portions of the patient's history were reviewed and updated as appropriate: allergies, current medications, past family history, past medical history, past social history, past surgical history and problem list. Problem list updated.  Objective:   Vitals:   09/23/16 1445  BP: 126/73  Pulse: 80  Weight: 139 lb 9.6 oz (63.3 kg)    Fetal Status:     Movement: Present     General:  Alert, oriented and cooperative. Patient is in no acute distress.  Skin: Skin is warm and dry. No rash noted.   Cardiovascular: Normal heart rate noted  Respiratory: Normal respiratory effort, no problems with respiration noted  Abdomen: Soft, gravid, appropriate for gestational age.  Pain/Pressure: Absent     Pelvic: Cervical exam deferred        Extremities: Normal range of motion.  Edema: None  Mental Status:  Normal mood and affect. Normal behavior. Normal judgment and thought content.   Assessment and Plan:  Pregnancy: G3P1102 at [redacted]w[redacted]d  1. Supervision of high risk pregnancy, antepartum Doing well  2. Gestational diabetes mellitus (GDM) in third trimester, gestational diabetes method of control unspecified Failed 2 hr, has appt with educator tomorrow  Preterm labor symptoms  and general obstetric precautions including but not limited to vaginal bleeding, contractions, leaking of fluid and fetal movement were reviewed in detail with the patient. Please refer to After Visit Summary for other counseling recommendations.  Return in about 2 weeks (around 10/07/2016).   Scheryl Darter, MD

## 2016-09-23 NOTE — Patient Instructions (Signed)
Diagnstico de diabetes mellitus gestacional (Gestational Diabetes Mellitus, Diagnosis) La diabetes gestacional (diabetes mellitus gestacional) es una forma temporal de diabetes que algunas mujeres desarrollan durante el Media planner. Generalmente, ocurre alrededor [employer] 24 a la 28de gestacin y desaparece despus del parto. Los cambios hormonales durante el embarazo pueden interferir en la produccin y la actividad de la [place], lo que puede derivar en uno de estos problemas o en ambos:  El pncreas no produce la cantidad suficiente de una hormona llamada insulina.  Las clulas del cuerpo no responden de [place] a la insulina que el organismo produce (resistencia a la insulina). Normalmente, la insulina estimula el ingreso de la glucosa en las clulas del cuerpo. Las clulas usan la glucosa para Dealer. La resistencia a la insulina o la falta de esta hormona hace que el exceso de glucosa se acumule en la sangre, en lugar de ir a las clulas. Como resultado, aumenta la glucemia (hiperglucemia). CULES SON LOS RIESGOS? Si la diabetes gestacional se trata, hay pocas probabilidades de que ocasione problemas. Si no se la controla con tratamiento, puede causar problemas durante el [place] de parto y [place], y algunos de esos problemas pueden ser dainos para el feto y la [place]. La diabetes gestacional que no se controla tambin puede producir problemas respiratorios y bajo nivel de glucemia en el recin nacido. Las mujeres que tienen diabetes gestacional son ms propensas a Lawyer afeccin si se embarazan de nuevo y a tener diabetes tipo2 en el futuro. QU INCREMENTA EL RIESGO? Es ms probable que Orthoptist en las mujeres embarazadas que:  Son [place] de 27aos durante el [place].  Tienen antecedentes familiares de diabetes.  Tienen sobrepeso.  Tuvieron diabetes gestacional en el pasado.  Tienen sndrome de ovario poliqustico (SOP).  Tienen  un embarazo gemelar o un embarazo mltiple.  Son descendientes de indgenas norteamericanos, afroamericanos, hispanos o latinos, o asiticos o isleos del [place] LOS SIGNOS O LOS SNTOMAS? La mayora de las mujeres no perciben los sntomas de la diabetes gestacional porque son similares a los sntomas normales del Media planner. Los sntomas de la diabetes gestacional pueden incluir, entre otros:  Aumento de la sed (polidipsia).  Aumento del apetito (polifagia).  Aumento de la miccin (poliuria). [place] SE DIAGNOSTICA? Esta afeccin se puede diagnosticar en funcin del nivel de glucemia que puede medirse con uno o ms de los siguientes anlisis de sangre:  Medicin de la glucemia en [place]. No se le permitir comer (tendr que [employer]) durante al menos 8horas antes de que se tome una [place] de [place].  Prueba al azar de la glucemia. Esta prueba mide la glucemia en cualquier momento del da, sin importar cundo comi.  Prueba de tolerancia a la glucosa oral (PTGO). Generalmente, se realiza [place] [employer] 24 a la 28de gestacin. ? Para esta prueba, le harn una medicin de la glucemia en ayunas. Luego, tomar una bebida que contiene glucosa. Se analizar el nivel de glucemia nuevamente 1hora despus de tomar la bebida con glucosa (PTGO a la hora). ? Si el resultado de la PTGO a la hora es igual o superior a 140mg /dl (7,13mmol/l), le repetirn la PTGO. Esta vez, se analizar el nivel de glucemia 3horas despus de tomar la bebida con glucosa (PTGO a las 3horas). Si tiene factores de [place], pueden hacerle pruebas de deteccin de la diabetes tipo2 no diagnosticada en la primera visita de atencin Medical sales representative (visita prenatal). CMO SE TRATA ESTA AFECCIN?  El tratamiento puede estar a cargo de Research officer, trade union. Para tratar esta afeccin, debe seguir las indicaciones del mdico respecto de lo siguiente:  Comer una dieta ms saludable y  aumentar la actividad fsica. Estos cambios son lo ms importante para mantener la diabetes gestacional bajo control.  Controlarse la glucemia. Hgalo con la frecuencia que le hayan indicado.  Tomar los medicamentos para la diabetes o aplicarse la [employer]. Estos frmacos se recetarn solamente si son necesarios. ? Si [place] insulina, tal vez deba ajustar las dosis en funcin de la cantidad de actividad fsica que realiza y de los alimentos que consume. El mdico le indicar cmo hacerlo. El mdico [employer] objetivos del tratamiento para usted sobre la base de la etapa del Media planner en la que se encuentra y de cualquier otra afeccin que padezca. Generalmente, el objetivo del tratamiento es [employer] siguientes niveles de glucemia durante el embarazo:  En ayunas: igual o menor que 95mg /dl (5,90mmol/l).  Despus de las comidas (posprandial): ? Una hora despus de una comida: igual o menor que 140mg /dl (7,78mmol/l). ? Dos horas despus de una comida: igual o menor que 120mg /dl (6,50mmol/l).  Nivel de A1c (hemoglobinaA1c): del 6% al 6,5%. SIGA ESTAS INDICACIONES EN SU CASA: Preguntas para hacerle al mdico Considere la posibilidad de hacer las siguientes preguntas:  Debo reunirme con Radio broadcast assistant para el cuidado de la diabetes?  Dnde puedo encontrar un grupo de apoyo para personas diabticas?  Qu equipos necesitar para controlar la diabetes en casa?  Qu medicamentos para la diabetes necesito y cundo debo tomarlos?  Con qu frecuencia debo controlarme la glucemia?  A qu nmero puedo llamar si tengo preguntas?  Cundo es mi prxima cita? Instrucciones generales  [employer] de venta libre y los recetados solamente como se lo haya indicado el mdico.  Controle el aumento de peso durante el [place]. La cantidad de [employer] se espera que aumente depende de su [place] (ndice de masa corporal) antes del embarazo.  Concurra a todas las  visitas de control como se lo haya indicado el mdico. Esto es importante.  Para obtener ms informacin sobre la diabetes, visite los siguientes sitios: ? Hotel manager de la Diabetes (American Diabetes Association, ADA): www.diabetes.org ? Asociacin Norteamericana de Instructores para el Cuidado de la Diabetes (American Association of Diabetes Educators, AADE): www.diabeteseducator.org/patient-resources COMUNQUESE CON UN MDICO SI:  El nivel de glucemia es igual o superior a 240mg /dl (13,72mmol/l).  El nivel de glucemia es igual o superior a 200mg /dl (11,42mmol/l), y tiene cetonas en la orina.  Ha estado enferma o ha tenido fiebre durante 2o ms das y no [place].  Tiene alguno de los siguientes problemas durante ms de 6horas: ? No puede comer ni beber. ? Tiene nuseas y vmitos. ? Tiene diarrea.  SOLICITE AYUDA DE INMEDIATO SI:  Su nivel de glucosa en la sangre est por debajo de 54mg /dl (20mmol/l).  Est confundida o tiene dificultad para pensar con claridad.  Tiene dificultad para respirar.  Tiene un nivel moderado o alto de cetonas en la [place].  El beb se mueve menos de lo habitual.  Tiene secreciones inusuales o sangrado de la vagina.  Comienza a tener contracciones antes de tiempo (prematuramente). Las contracciones se pueden sentir como un endurecimiento de la parte inferior del abdomen.  Esta informacin no tiene Marine scientist el consejo del mdico. Asegrese de hacerle al mdico cualquier pregunta que tenga. Document Released: 10/29/2004 Document Revised: 05/13/2015 Document Reviewed: 02/22/2015 Elsevier Interactive  Patient Education  2017 Elsevier Inc.  

## 2016-09-24 ENCOUNTER — Encounter: Payer: Self-pay | Attending: Obstetrics & Gynecology | Admitting: *Deleted

## 2016-09-24 ENCOUNTER — Ambulatory Visit: Payer: Self-pay | Admitting: *Deleted

## 2016-09-24 DIAGNOSIS — R7302 Impaired glucose tolerance (oral): Secondary | ICD-10-CM | POA: Insufficient documentation

## 2016-09-24 DIAGNOSIS — Z713 Dietary counseling and surveillance: Secondary | ICD-10-CM | POA: Insufficient documentation

## 2016-09-24 DIAGNOSIS — R7309 Other abnormal glucose: Secondary | ICD-10-CM

## 2016-09-24 NOTE — Progress Notes (Signed)
  Patient was seen on 09/24/2016 for Gestational Diabetes self-management.  Patient speaks Spanish, used Recruitment consultant successfully.  Patient states no history of GDM. EDD 12/10/2016. 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 Diet history taken, she typically eats breakfast and lunch and perhaps a light evenng meal if anything that time of day. Snacks include fresh fruit or yogurt occasionally. The following learning objectives were met by the patient :   States the definition of Gestational Diabetes  States why dietary management is important in controlling blood glucose  Describes the effects of carbohydrates on blood glucose levels  Demonstrates ability to create a balanced meal plan  Demonstrates carbohydrate counting   States when to check blood glucose levels  Demonstrates proper blood glucose monitoring techniques  States the effect of stress and exercise on blood glucose levels  States the importance of limiting caffeine and abstaining from alcohol and smoking  I suggested an evening snack with 1 bread or tortilla and some protein to help bring FBG down.   Patient is aware to get more supplies for meter as needed here at the Clinic  Plan:  Aim for 3 Carb Choices per meal (45 grams) +/- 1 either way  Aim for 1-2 Carbs per snack Begin reading food labels for Total Carbohydrate of foods Consider  increasing your activity level by walking or other activity daily as tolerated Begin checking BG before breakfast and 2 hours after first bite of breakfast, lunch and dinner as directed by MD  Bring Log Book to every medical appointment   Take medication if directed by MD  Blood glucose monitor given: True Track Lot # [id number] Exp: 05/23/2018 Blood glucose reading: 102 mg/dl  Patient instructed to monitor glucose levels: FBS: 60 - 95 mg/dl 2 hour: <120 mg/dl  Patient received the following handouts:in Spanish  Nutrition Diabetes and  Pregnancy  Carbohydrate Counting List  Patient will be seen for follow-up as needed.

## 2016-09-29 ENCOUNTER — Ambulatory Visit (INDEPENDENT_AMBULATORY_CARE_PROVIDER_SITE_OTHER): Payer: Self-pay | Admitting: Medical

## 2016-09-29 VITALS — BP 104/69 | HR 72

## 2016-09-29 DIAGNOSIS — O09213 Supervision of pregnancy with history of pre-term labor, third trimester: Secondary | ICD-10-CM

## 2016-09-29 DIAGNOSIS — O09899 Supervision of other high risk pregnancies, unspecified trimester: Secondary | ICD-10-CM

## 2016-09-29 DIAGNOSIS — O09219 Supervision of pregnancy with history of pre-term labor, unspecified trimester: Principal | ICD-10-CM

## 2016-09-29 NOTE — Progress Notes (Signed)
Chart reviewed. Agree with plan of care initiated by the CMA.   Marny Lowenstein, PA-C 09/29/2016 2:31 PM

## 2016-09-29 NOTE — Progress Notes (Signed)
17-p injection given today. Patient tolerated well

## 2016-10-06 ENCOUNTER — Ambulatory Visit (INDEPENDENT_AMBULATORY_CARE_PROVIDER_SITE_OTHER): Payer: Self-pay

## 2016-10-06 DIAGNOSIS — O09213 Supervision of pregnancy with history of pre-term labor, third trimester: Secondary | ICD-10-CM

## 2016-10-06 DIAGNOSIS — Z8751 Personal history of pre-term labor: Secondary | ICD-10-CM

## 2016-10-06 NOTE — Progress Notes (Signed)
Patient presented to the office for a 17p injection. Patient tolerated well. 

## 2016-10-07 NOTE — Progress Notes (Signed)
I have reviewed this chart and agree with the nurse's documentation.   Loria Lacina Leftwich-Kirby, CNM  

## 2016-10-13 ENCOUNTER — Ambulatory Visit (INDEPENDENT_AMBULATORY_CARE_PROVIDER_SITE_OTHER): Payer: Self-pay | Admitting: Obstetrics & Gynecology

## 2016-10-13 DIAGNOSIS — O24419 Gestational diabetes mellitus in pregnancy, unspecified control: Secondary | ICD-10-CM

## 2016-10-13 DIAGNOSIS — O0990 Supervision of high risk pregnancy, unspecified, unspecified trimester: Secondary | ICD-10-CM

## 2016-10-13 DIAGNOSIS — O0993 Supervision of high risk pregnancy, unspecified, third trimester: Secondary | ICD-10-CM

## 2016-10-13 DIAGNOSIS — O09213 Supervision of pregnancy with history of pre-term labor, third trimester: Secondary | ICD-10-CM

## 2016-10-13 NOTE — Progress Notes (Signed)
   PRENATAL VISIT NOTE  Subjective:  Monique Hutchinson is a 41 y.o. G3P1102 at 2277w5d being seen today for ongoing prenatal care.  She is currently monitored for the following issues for this high-risk pregnancy and has Advanced maternal age in multigravida; History of prior pregnancy with SGA newborn; Onychomycosis; History of preterm delivery, currently pregnant; History of VBAC; Language barrier; Supervision of high risk pregnancy, antepartum; Low grade squamous intraepith lesion on cytologic smear cervix (lgsil); History of severe pre-eclampsia; and GDM (gestational diabetes mellitus) on her problem list.  Patient reports no complaints.   .  .   . Denies leaking of fluid.   The following portions of the patient's history were reviewed and updated as appropriate: allergies, current medications, past family history, past medical history, past social history, past surgical history and problem list. Problem list updated.  Objective:  There were no vitals filed for this visit.  Fetal Status:           General:  Alert, oriented and cooperative. Patient is in no acute distress.  Skin: Skin is warm and dry. No rash noted.   Cardiovascular: Normal heart rate noted  Respiratory: Normal respiratory effort, no problems with respiration noted  Abdomen: Soft, gravid, appropriate for gestational age.        Pelvic: Cervical exam deferred        Extremities: Normal range of motion.     Mental Status:  Normal mood and affect. Normal behavior. Normal judgment and thought content.   Assessment and Plan:  Pregnancy: G3P1102 at 6077w5d  1. Gestational diabetes mellitus (GDM) in third trimester, gestational diabetes method of control unspecified FBS 50% 95-100, PP <120  2. Supervision of high risk pregnancy, antepartum F/U in 3 weeks  Preterm labor symptoms and general obstetric precautions including but not limited to vaginal bleeding, contractions, leaking of fluid and fetal movement were reviewed in  detail with the patient. Please refer to After Visit Summary for other counseling recommendations.  Return in about 2 weeks (around 10/27/2016).   Scheryl DarterJames Maygan Koeller, MD

## 2016-10-13 NOTE — Patient Instructions (Signed)

## 2016-10-13 NOTE — Progress Notes (Signed)
17p injection given.  

## 2016-10-20 ENCOUNTER — Ambulatory Visit (INDEPENDENT_AMBULATORY_CARE_PROVIDER_SITE_OTHER): Payer: Self-pay

## 2016-10-20 VITALS — BP 122/70 | HR 70

## 2016-10-20 DIAGNOSIS — Z8751 Personal history of pre-term labor: Secondary | ICD-10-CM

## 2016-10-20 DIAGNOSIS — O09213 Supervision of pregnancy with history of pre-term labor, third trimester: Secondary | ICD-10-CM

## 2016-10-20 NOTE — Progress Notes (Signed)
Patient presented to the office for a 17p injection. Patient tolerated well. Next injection is due in one week.

## 2016-10-27 ENCOUNTER — Ambulatory Visit (INDEPENDENT_AMBULATORY_CARE_PROVIDER_SITE_OTHER): Payer: Self-pay | Admitting: Obstetrics and Gynecology

## 2016-10-27 VITALS — BP 113/76 | HR 68 | Wt 144.1 lb

## 2016-10-27 DIAGNOSIS — R87612 Low grade squamous intraepithelial lesion on cytologic smear of cervix (LGSIL): Secondary | ICD-10-CM

## 2016-10-27 DIAGNOSIS — Z23 Encounter for immunization: Secondary | ICD-10-CM

## 2016-10-27 DIAGNOSIS — Z8759 Personal history of other complications of pregnancy, childbirth and the puerperium: Secondary | ICD-10-CM

## 2016-10-27 DIAGNOSIS — Z98891 History of uterine scar from previous surgery: Secondary | ICD-10-CM

## 2016-10-27 DIAGNOSIS — O0993 Supervision of high risk pregnancy, unspecified, third trimester: Secondary | ICD-10-CM

## 2016-10-27 DIAGNOSIS — O09899 Supervision of other high risk pregnancies, unspecified trimester: Secondary | ICD-10-CM

## 2016-10-27 DIAGNOSIS — O09213 Supervision of pregnancy with history of pre-term labor, third trimester: Secondary | ICD-10-CM

## 2016-10-27 DIAGNOSIS — O09523 Supervision of elderly multigravida, third trimester: Secondary | ICD-10-CM

## 2016-10-27 DIAGNOSIS — O0990 Supervision of high risk pregnancy, unspecified, unspecified trimester: Secondary | ICD-10-CM

## 2016-10-27 DIAGNOSIS — O09219 Supervision of pregnancy with history of pre-term labor, unspecified trimester: Secondary | ICD-10-CM

## 2016-10-27 DIAGNOSIS — O24410 Gestational diabetes mellitus in pregnancy, diet controlled: Secondary | ICD-10-CM

## 2016-10-27 LAB — POCT URINALYSIS DIP (DEVICE)
Bilirubin Urine: NEGATIVE
Glucose, UA: NEGATIVE mg/dL
Hgb urine dipstick: NEGATIVE
KETONES UR: NEGATIVE mg/dL
Nitrite: NEGATIVE
Protein, ur: NEGATIVE mg/dL
SPECIFIC GRAVITY, URINE: 1.01 (ref 1.005–1.030)
Urobilinogen, UA: 0.2 mg/dL (ref 0.0–1.0)
pH: 7 (ref 5.0–8.0)

## 2016-10-27 NOTE — Patient Instructions (Signed)
Levonorgestrel intrauterine device (IUD) Qu es este medicamento? El DIU CON LEVONORGESTREL es un dispositivo anticonceptivo (control de la natalidad). Un profesional de la salud coloca el dispositivo dentro el tero. Se usa para evitar los embarazos. Este dispositivo tambin puede usarse para tratar el sangrado intenso que se produce durante el perodo menstrual. Este medicamento puede ser utilizado para otros usos; si tiene alguna pregunta consulte con su proveedor de atencin mdica o con su farmacutico. MARCAS COMUNES: Kyleena, LILETTA, Mirena, Skyla Qu le debo informar a mi profesional de la salud antes de tomar este medicamento? Necesitan saber si usted presenta alguno de los siguientes problemas o situaciones: examen de Papanicolaou anormal cncer de mama, tero o crvix diabetes endometritis infeccin genital o plvica actual o en el pasado tener ms de una pareja sexual o si su pareja tiene ms de una pareja enfermedad cardiaca antecedentes de embarazo ectpico o tubrico problemas del sistema inmunolgico DIU colocado en su lugar enfermedad o tumor heptico problemas con cogulos sanguneos o tomar anticoagulantes convulsiones uso de drogas intravenosas tero con forma inusual sangrado vaginal que no ha sido explicado una reaccin alrgica o inusual al levonorgestrel, a otras hormonas, a la silicona, o al polietileno, a otros medicamentos, alimentos, colorantes o conservantes si est embarazada o buscando quedar embarazada si est amamantando a un beb Cmo debo utilizar este medicamento? Un profesional de la salud coloca este dispositivo en el tero. Hable con su pediatra para informarse acerca del uso de este medicamento en nios. Puede requerir atencin especial. Sobredosis: Pngase en contacto inmediatamente con un centro toxicolgico o una sala de urgencia si usted cree que haya tomado demasiado medicamento. ATENCIN: Este medicamento es solo para usted. No comparta este medicamento  con nadie. Qu sucede si me olvido de una dosis? No se aplica en este caso. Segn la marca del dispositivo que tenga insertado, el dispositivo deber reemplazarse cada 3 a 5 aos si desea continuar usando este tipo de mtodo anticonceptivo. Qu puede interactuar con este medicamento? No tome este medicamento con ninguno de los siguientes frmacos: amprenavir bosentano fosamprenavir Este medicamento tambin puede interactuar con los siguientes medicamentos: aprepitant armodafinilo barbitricos para inducir el sueo o para el tratamiento de convulsiones bexaroteno boceprevir griseofulvina medicamentos para tratar convulsiones, tales como carbamazepina, etotona, felbamato, oxcarbazepina, fenitona, topiramato modafinilo pioglitazona rifabutina rifampicina rifapentina algunos medicamentos para tratar la infeccin por el VIH, tales como atazanavir, efavirenz, indinavir, lopinavir, nelfinavir, tipranavir, ritonavir hierba de [place] warfarina Puede ser que esta lista no menciona todas las posibles interacciones. Informe a su profesional de la salud de todos los productos a base de hierbas, medicamentos de venta libre o suplementos nutritivos que est tomando. Si usted fuma, consume bebidas alcohlicas o si utiliza drogas ilegales, indqueselo tambin a su profesional de la salud. Algunas sustancias pueden interactuar con su medicamento. A qu debo estar atento al usar este medicamento? Visite a su mdico o a su profesional de la salud para revisar su evolucin peridicamente. Consulte a su mdico si usted o su pareja tiene contacto sexual con otras personas, descubre que tiene VIH positivo, o contrae una enfermedad de transmisin sexual. Este producto no la protege de la infeccin por VIH (SIDA) ni de ninguna otra enfermedad de transmisin sexual. Puede verificar la colocacin del DIU usted misma colocando los dedos limpios en la parte superior de la vagina para tocar los hilos. No tire de los hilos. Es  un buen hbito verificar la colocacin del DIU despus de cada perodo menstrual. Llame a su mdico   de inmediato si siente ms del DIU que solo los hilos o si no puede sentir los hilos. El DIU podra salirse solo. Es posible que quede embarazada si el dispositivo se sale. Si nota que se ha [employer] use un mtodo anticonceptivo de respaldo, [place] condones, y llame a su proveedor de Psychologist, prison and probation services. Usar tampones no cambiar la posicin del DIU y puede usarlos sin problema durante sus perodos menstruales. Este DIU puede escanearse en forma segura en una resonancia magntica (MRI) solo bajo condiciones especficas. Antes de realizarse [employer], informe a su proveedor de atencin mdica que tiene colocado un DIU, y el tipo de DIU que tiene. Qu efectos secundarios puedo tener al [employer] este medicamento? Efectos secundarios que debe informar a su mdico o a Producer, television/film/video de la salud tan pronto como sea posible: -Therapist, art como erupcin cutnea, picazn o urticarias, hinchazn de la cara, labios o lengua -fiebre, sntomas gripales -llagas genitales -alta presin sangunea -ausencia de un perodo menstrual durante 6 semanas mientras lo utiliza -Engineer, mining, Public librarian en las piernas -dolor o sensibilidad del plvico -dolor de cabeza repentino o severo -signos de Psychiatrist -calambres estomacales -falta de aliento repentina -problemas de coordinacin, del habla, al caminar -sangrado, flujo vaginal inusual -color amarillento de los ojos o la piel Efectos secundarios que, por lo general, no requieren atencin mdica (debe informarlos a su mdico o a su profesional de la salud si persisten o si son molestos): -acn -dolor de pecho -cambios en el deseo sexual o capacidad -cambios de peso -calambres, Research scientist (life sciences) o sensacin de [employer] se introduce el dispositivo -dolor de cabeza -sangrado menstruales irregulares en los primeros 3 a 6 meses de usar -nuseas Puede  ser que esta lista no menciona todos los posibles efectos secundarios. Comunquese a su mdico por asesoramiento mdico [employer]. Usted puede informar los efectos secundarios a la FDA por telfono al 1-800-FDA-1088. Dnde debo guardar mi medicina? No se aplica en este caso. ATENCIN: Este folleto es un resumen. Puede ser que no cubra toda la posible informacin. Si usted tiene preguntas acerca de esta medicina, consulte con su mdico, su farmacutico o su profesional de Radiographer, therapeutic.  2018 Elsevier/Gold Standard (2016-02-20 00:00:00) Informacin sobre el dispositivo intrauterino (Intrauterine Device Information) Un dispositivo intrauterino (DIU) se inserta en el tero e impide el embarazo. Hay dos tipos de DIU:  DIU de cobre: este tipo de DIU est recubierto con un alambre de cobre y se inserta dentro del tero. El cobre hace que el tero y las trompas de [place] produzcan un liquido que [employer] espermatozoides. El DIU de cobre puede Geneticist, molecular durante 10 aos.  DIU con hormona: este tipo de DIU contiene la hormona progestina (progesterona sinttica). Las hormonas hacen que el moco cervical se haga ms espeso, lo que evita que el esperma ingrese al tero. Tambin hace que la membrana que recubre internamente al tero sea ms delgada lo que impide el implante del vulo fertilizado. La hormona debilita o destruye los espermatozoides que ingresan al tero. Alguno de los tipos de DIU hormonal pueden Geneticist, molecular durante 5 aos y otros tipos pueden dejarse en el lugar por 3 aos. El mdico se asegurar de que usted sea una buena candidata para usar el DIU. Converse con su mdico acerca de los posibles efectos secundarios. VENTAJASDEL DISPOSITIVO INTRAUTERINO  El DIU es muy eficaz, reversible, de accin prolongada y de bajo mantenimiento.  No hay efectos secundarios relacionados con  el estrgeno.  El DIU puede ser utilizado durante la Market researcher.  No est  asociado con el aumento de [place].  Funciona inmediatamente despus de la insercin.  El DIU hormonal funciona inmediatamente si se inserta dentro de los [address] del inicio del perodo. Ser necesario que utilice un mtodo anticonceptivo adicional durante 7 das si el DIU hormonal se inserta en algn otro momento del ciclo.  El DIU de cobre no interfiere con las hormonas femeninas.  El DIU hormonal puede hacer que los perodos menstruales abundantes se hagan ms ligeros y que haya menos clicos.  El DIU hormonal puede usarse durante 3 a 5 aos.  El DIU de cobre puede usarse durante 10 aos.  DESVENTAJASDEL DISPOSITIVO [employer] DIU hormonal puede estar asociado con patrones de sangrado irregular.  El DIU de cobre puede hacer que el flujo menstrual ms abundante y doloroso.  Puede experimentar clicos y sangrado vaginal despus de la insercin.  Esta informacin no tiene Theme park manager el consejo del mdico. Asegrese de hacerle al mdico cualquier pregunta que tenga. Document Released: 07/09/2009 Document Revised: 05/13/2015 Document Reviewed: 07/10/2012 Elsevier Interactive Patient Education  2017 [employer]. Etonogestrel implant Qu es este medicamento? El ETONOGESTREL es un dispositivo anticonceptivo (control de la natalidad). Se [place] para evitar los embarazos. Se puede usar hasta por 3 aos. Este medicamento puede ser utilizado para otros usos; si tiene alguna pregunta consulte con su proveedor de atencin mdica o con su farmacutico. MARCAS COMUNES: Implanon, Nexplanon Qu le debo informar a mi profesional de la salud antes de tomar este medicamento? Necesita saber si usted presenta alguno de los siguientes problemas o situaciones: sangrado vaginal anormal enfermedad vascular o cogulos sanguneos cncer de mama, cervical, heptico depresin diabetes enfermedad de la vescula biliar dolores de cabeza enfermedad cardiaca o ataque cardiaco reciente alta presin  sangunea alto nivel de colesterol enfermedad renal enfermedad heptica convulsiones fuma tabaco una reaccin alrgica o inusual al etonogestrel, otras hormonas, anestsicos o antispticos, medicamentos, alimentos, colorantes o conservantes si est embarazada o buscando quedar embarazada si est amamantando a un beb Cmo debo [employer]? Un profesional de la salud inserta este dispositivo justo debajo de la piel en la parte interior del brazo. Hable con su pediatra para informarse acerca del uso de este medicamento en nios. Puede requerir atencin especial. Sobredosis: Pngase en contacto inmediatamente con un centro toxicolgico o una sala de urgencia si usted cree que haya tomado demasiado medicamento. ATENCIN: [employer] es solo para usted. No comparta este medicamento con nadie. Qu sucede si me olvido de una dosis? No se aplica en este caso. Qu puede interactuar con este medicamento? No tome este medicamento con ninguno de los siguientes frmacos: amprenavir bosentano fosamprenavir Este medicamento tambin podra interactuar con los siguientes medicamentos: barbitricos para inducir el sueo o para el tratamiento de convulsiones ciertos medicamentos para infecciones micticas, tales como itraconazol y Associate Professor jugo de toronja griseofulvina medicamentos para tratar convulsiones, tales como [place], [place], [place], [place], topiramato modafinilo fenilbutazona rifampicina rufinamida algunos medicamentos para tratar la infeccin por el VIH, tales como atazanavir, indinavir, lopinavir, nelfinavir, tipranavir, ritonavir hierba de [address] ser que esta lista no menciona todas las posibles interacciones. Informe a su profesional de [employer] de [employer] productos a base de hierbas, medicamentos de [place] o suplementos nutritivos que est tomando. Si usted fuma, consume bebidas alcohlicas o si utiliza drogas ilegales, indqueselo tambin a su  profesional de [employer]. Algunas sustancias pueden interactuar con su  medicamento. A qu debo estar atento al usar [employer]? Este producto no protege contra la infeccin por el VIH (SIDA) u otras enfermedades de transmisin sexual. Usted debe sentir el implante al presionar con las yemas de los dedos sobre la piel donde se insert. Contacte a su mdico si no se siente el implante y [place] un mtodo anticonceptivo no hormonal (como el condn) hasta que el mdico confirma que el implante est en su Environmental consultant. Si siente que el implante puede haber roto o doblado en su brazo, pngase en contacto con su proveedor de atencin mdica. Qu efectos secundarios puedo tener al [employer] este medicamento? Efectos secundarios que debe informar a su mdico o a Producer, television/film/video de la salud tan pronto como sea posible: Therapist, art, como erupcin cutnea, picazn o urticarias, e hinchazn de la cara, los labios o la lengua bultos en las mamas cambios en las emociones o el estado de nimo estado de nimo deprimido sangrado menstrual intenso o Software engineer, irritacin, hinchazn o Counsellor de la insercin Immunologist de la insercin signos de infeccin en el sitio de insercin tales como fiebre, y enrojecimiento de la piel, Engineer, mining o secrecin signos de Psychiatrist signos y sntomas de un cogulo sanguneo, tales como problemas respiratorios; cambios en la visin; dolor en el pecho; dolor de cabeza severo, repentino; dolor, hinchazn, calor en la pierna; dificultad para hablar; entumecimiento o debilidad repentina de la cara, el brazo o la pierna signos y sntomas de lesin al hgado, como orina amarilla oscura o [place]; sensacin general de estar enfermo o sntomas gripales; heces claras; prdida de apetito; nuseas; dolor en la regin abdominal superior derecha; cansancio o debilidad inusual; color amarillento de los ojos o la piel sangrado vaginal inusual, secrecin signos y sntomas de un derrame  cerebral, tales como cambios en la visin; confusin; dificultad para hablar o entender; dolores de cabeza severos; entumecimiento o debilidad repentina de la cara, el brazo o la pierna; problemas al Advertising account planner; Psychiatrist; prdida del equilibrio o la coordinacin Efectos secundarios que generalmente no requieren Psychologist, prison and probation services (infrmelos a su mdico o a Producer, television/film/video de la salud si persisten o si son molestos): acn dolor de Retail buyer en las mamas cambios de peso mareos sensacin general de estar enfermo o sntomas gripales dolor de cabeza sangrado menstrual irregular nuseas dolor de garganta irritacin o inflamacin vaginal Puede ser que esta lista no menciona todos los posibles efectos secundarios. Comunquese a su mdico por asesoramiento mdico [employer]. Usted puede informar los efectos secundarios a la FDA por telfono al 1-800-FDA-1088. Dnde debo guardar mi medicina? Este medicamento se administra en hospitales o clnicas y no necesitar guardarlo en su domicilio. ATENCIN: Este folleto es un resumen. Puede ser que no cubra toda la posible informacin. Si usted tiene preguntas acerca de esta medicina, consulte con su mdico, su farmacutico o su profesional de Radiographer, therapeutic.  2018 Elsevier/Gold Standard (2016-02-20 00:00:00)

## 2016-10-27 NOTE — Progress Notes (Signed)
Rout.

## 2016-10-27 NOTE — Addendum Note (Signed)
Addended byGerome Apley on: 10/27/2016 02:30 PM   Modules accepted: Orders

## 2016-10-27 NOTE — Progress Notes (Signed)
   PRENATAL VISIT NOTE  Subjective:  Monique Hutchinson is a 41 y.o. G3P1102 at [redacted]w[redacted]d being seen today for ongoing prenatal care.  She is currently monitored for the following issues for this high-risk pregnancy and has Advanced maternal age in multigravida; History of prior pregnancy with SGA newborn; Onychomycosis; History of preterm delivery, currently pregnant; History of VBAC; Language barrier; Supervision of high risk pregnancy, antepartum; Low grade squamous intraepith lesion on cytologic smear cervix (lgsil); History of severe pre-eclampsia; and GDM (gestational diabetes mellitus) on her problem list.  Patient reports no complaints.  Contractions: Irregular. Vag. Bleeding: None.  Movement: Present. Denies leaking of fluid.   Reports fasting BG 90s and post prandials 120s-130s. Did not bring card, reviewed for her to bring chart next time.  The following portions of the patient's history were reviewed and updated as appropriate: allergies, current medications, past family history, past medical history, past social history, past surgical history and problem list. Problem list updated.  Objective:   Vitals:   10/27/16 1309  BP: 113/76  Pulse: 68  Weight: 144 lb 1.6 oz (65.4 kg)    Fetal Status: Fetal Heart Rate (bpm): 130 Fundal Height: 34 cm Movement: Present     General:  Alert, oriented and cooperative. Patient is in no acute distress.  Skin: Skin is warm and dry. No rash noted.   Cardiovascular: Normal heart rate noted  Respiratory: Normal respiratory effort, no problems with respiration noted  Abdomen: Soft, gravid, appropriate for gestational age.  Pain/Pressure: Absent     Pelvic: Cervical exam deferred        Extremities: Normal range of motion.  Edema: Trace  Mental Status:  Normal mood and affect. Normal behavior. Normal judgment and thought content.   Assessment and Plan:  Pregnancy: [id number] at [redacted]w[redacted]d doing well, no complaints today.   1. Elderly multigravida in  third trimester Normal [place], declined further testing  2. History of preterm delivery, currently pregnant Weekly 17-OH  3. Supervision of high risk pregnancy, antepartum Next [place] scheduled for 11/03/16, patient aware Flu shot today  4. History of severe pre-eclampsia On ASA 81, BP remain wnl  5. Diet controlled gestational diabetes mellitus (GDM) in third trimester Advised to continue diet control  6. History of VBAC Desires another VBAC  7. Low grade squamous intraepith lesion on cytologic smear cervix (lgsil) Needs colpo postpartum  Interview completed via interpretor  Preterm labor symptoms and general obstetric precautions including but not limited to vaginal bleeding, contractions, leaking of fluid and fetal movement were reviewed in detail with the patient. Please refer to After Visit Summary for other counseling recommendations.  Return in about 1 week (around 11/03/2016) for OB visit (MD), 17P.   Conan Bowens, MD

## 2016-11-03 ENCOUNTER — Ambulatory Visit (HOSPITAL_COMMUNITY)
Admission: RE | Admit: 2016-11-03 | Discharge: 2016-11-03 | Disposition: A | Discharge status: Home / Self Care | Payer: Self-pay | Source: Ambulatory Visit | Attending: Obstetrics & Gynecology | Admitting: Obstetrics & Gynecology

## 2016-11-03 ENCOUNTER — Ambulatory Visit (INDEPENDENT_AMBULATORY_CARE_PROVIDER_SITE_OTHER): Payer: Self-pay | Admitting: *Deleted

## 2016-11-03 ENCOUNTER — Other Ambulatory Visit (HOSPITAL_COMMUNITY): Payer: Self-pay | Admitting: Maternal and Fetal Medicine

## 2016-11-03 ENCOUNTER — Encounter (HOSPITAL_COMMUNITY): Payer: Self-pay

## 2016-11-03 VITALS — BP 122/69 | HR 74 | Wt 142.9 lb

## 2016-11-03 DIAGNOSIS — O09529 Supervision of elderly multigravida, unspecified trimester: Secondary | ICD-10-CM

## 2016-11-03 DIAGNOSIS — Z8632 Personal history of gestational diabetes: Secondary | ICD-10-CM

## 2016-11-03 DIAGNOSIS — Z3A34 34 weeks gestation of pregnancy: Secondary | ICD-10-CM | POA: Insufficient documentation

## 2016-11-03 DIAGNOSIS — O34219 Maternal care for unspecified type scar from previous cesarean delivery: Secondary | ICD-10-CM

## 2016-11-03 DIAGNOSIS — Z8759 Personal history of other complications of pregnancy, childbirth and the puerperium: Secondary | ICD-10-CM

## 2016-11-03 DIAGNOSIS — O09523 Supervision of elderly multigravida, third trimester: Secondary | ICD-10-CM | POA: Insufficient documentation

## 2016-11-03 DIAGNOSIS — E669 Obesity, unspecified: Secondary | ICD-10-CM | POA: Insufficient documentation

## 2016-11-03 DIAGNOSIS — O09899 Supervision of other high risk pregnancies, unspecified trimester: Secondary | ICD-10-CM

## 2016-11-03 DIAGNOSIS — O09213 Supervision of pregnancy with history of pre-term labor, third trimester: Secondary | ICD-10-CM

## 2016-11-03 DIAGNOSIS — O09293 Supervision of pregnancy with other poor reproductive or obstetric history, third trimester: Secondary | ICD-10-CM

## 2016-11-03 DIAGNOSIS — O09219 Supervision of pregnancy with history of pre-term labor, unspecified trimester: Secondary | ICD-10-CM

## 2016-11-03 DIAGNOSIS — O99213 Obesity complicating pregnancy, third trimester: Secondary | ICD-10-CM

## 2016-11-03 NOTE — Progress Notes (Signed)
Makena 250 mg IM administered as scheduled.  Pt tolerated well. Next injection 10/9.

## 2016-11-03 NOTE — Progress Notes (Signed)
Chart reviewed for nurse visit. Agree with plan of care.   Judeth Horn, NP 11/03/2016 2:02 PM

## 2016-11-10 ENCOUNTER — Ambulatory Visit: Payer: Self-pay

## 2016-11-10 ENCOUNTER — Ambulatory Visit (INDEPENDENT_AMBULATORY_CARE_PROVIDER_SITE_OTHER): Payer: Self-pay | Admitting: Family Medicine

## 2016-11-10 VITALS — BP 121/77 | HR 70 | Wt 146.6 lb

## 2016-11-10 DIAGNOSIS — O24415 Gestational diabetes mellitus in pregnancy, controlled by oral hypoglycemic drugs: Secondary | ICD-10-CM

## 2016-11-10 DIAGNOSIS — O0990 Supervision of high risk pregnancy, unspecified, unspecified trimester: Secondary | ICD-10-CM

## 2016-11-10 DIAGNOSIS — Z8751 Personal history of pre-term labor: Secondary | ICD-10-CM

## 2016-11-10 DIAGNOSIS — O09523 Supervision of elderly multigravida, third trimester: Secondary | ICD-10-CM

## 2016-11-10 DIAGNOSIS — O09899 Supervision of other high risk pregnancies, unspecified trimester: Secondary | ICD-10-CM

## 2016-11-10 DIAGNOSIS — O0993 Supervision of high risk pregnancy, unspecified, third trimester: Secondary | ICD-10-CM

## 2016-11-10 DIAGNOSIS — O09219 Supervision of pregnancy with history of pre-term labor, unspecified trimester: Secondary | ICD-10-CM

## 2016-11-10 DIAGNOSIS — O09213 Supervision of pregnancy with history of pre-term labor, third trimester: Secondary | ICD-10-CM

## 2016-11-10 LAB — POCT URINALYSIS DIP (DEVICE)
Bilirubin Urine: NEGATIVE
GLUCOSE, UA: NEGATIVE mg/dL
Hgb urine dipstick: NEGATIVE
KETONES UR: NEGATIVE mg/dL
NITRITE: NEGATIVE
PH: 6.5 (ref 5.0–8.0)
PROTEIN: NEGATIVE mg/dL
Specific Gravity, Urine: 1.01 (ref 1.005–1.030)
UROBILINOGEN UA: 0.2 mg/dL (ref 0.0–1.0)

## 2016-11-10 LAB — OB RESULTS CONSOLE GBS: GBS: POSITIVE

## 2016-11-10 LAB — OB RESULTS CONSOLE GC/CHLAMYDIA: GC PROBE AMP, GENITAL: NEGATIVE

## 2016-11-10 MED ORDER — GLYBURIDE 2.5 MG PO TABS: 1.2500 mg | ORAL_TABLET | Freq: Every day | ORAL | 1 refills | Status: DC

## 2016-11-10 NOTE — Progress Notes (Signed)
   PRENATAL VISIT NOTE  Subjective:  Monique Hutchinson is a 41 y.o. G3P1102 at [redacted]w[redacted]d being seen today for ongoing prenatal care.  She is currently monitored for the following issues for this high-risk pregnancy and has Advanced maternal age in multigravida; History of prior pregnancy with SGA newborn; Onychomycosis; History of preterm delivery, currently pregnant; History of VBAC; Language barrier; Supervision of high risk pregnancy, antepartum; Low grade squamous intraepith lesion on cytologic smear cervix (lgsil); History of severe pre-eclampsia; and Gestational diabetes on her problem list.  Patient reports no complaints.  Contractions: Not present. Vag. Bleeding: None.  Movement: Present. Denies leaking of fluid.   The following portions of the patient's history were reviewed and updated as appropriate: allergies, current medications, past family history, past medical history, past social history, past surgical history and problem list. Problem list updated.  Objective:   Vitals:   11/10/16 1259  BP: 121/77  Pulse: 70  Weight: 146 lb 9.6 oz (66.5 kg)    Fetal Status: Fetal Heart Rate (bpm): 132 Fundal Height: 33 cm Movement: Present  Presentation: Transverse (head on maternal Rt, spine down)  General:  Alert, oriented and cooperative. Patient is in no acute distress.  Skin: Skin is warm and dry. No rash noted.   Cardiovascular: Normal heart rate noted  Respiratory: Normal respiratory effort, no problems with respiration noted  Abdomen: Soft, gravid, appropriate for gestational age.  Pain/Pressure: Absent     Pelvic: Cervical exam performed Dilation: 1 Effacement (%): Thick Station: Ballotable  Extremities: Normal range of motion.  Edema: Trace  Mental Status:  Normal mood and affect. Normal behavior. Normal judgment and thought content.  FBS 90-94 2 hr pp 80-135 Video Spanish interpreter: Marchelle Folks [phone] used NST:  Baseline: 135 bpm, Variability: Good {> 6 bpm), Accelerations:  Reactive and Decelerations: Absent   Assessment and Plan:  Pregnancy: G3P1102 at [redacted]w[redacted]d  1. History of preterm delivery Weekly 17 P  2. Supervision of high risk pregnancy, antepartum - Culture, beta strep (group b only) - GC/Chlamydia probe amp ([place])not at [facility]  3. Elderly multigravida in third trimester   4. Gestational diabetes mellitus (GDM) in third trimester controlled on oral hypoglycemic drug CBGs are not at goal, add Glyburide--begin weekly testing - glyBURIDE (DIABETA) 2.5 MG tablet; Take 0.5 tablets (1.25 mg total) by mouth daily with breakfast.  Dispense: 30 tablet; Refill: 1 - US FETAL BPP W/NONSTRESS; Future  Term labor symptoms and general obstetric precautions including but not limited to vaginal bleeding, contractions, leaking of fluid and fetal movement were reviewed in detail with the patient. Please refer to After Visit Summary for other counseling recommendations.  Return in 1 week (on 11/17/2016) for [facility], OB visit and NST/BPP;  Appt on 10/26 will need to be changed.   Reva Bores, MD

## 2016-11-10 NOTE — Patient Instructions (Signed)
Breastfeeding Deciding to breastfeed is one of the best choices you can make for you and your baby. A change in hormones during pregnancy causes your breast tissue to grow and increases the number and size of your milk ducts. These hormones also allow proteins, sugars, and fats from your blood supply to make breast milk in your milk-producing glands. Hormones prevent breast milk from being released before your baby is born as well as prompt milk flow after birth. Once breastfeeding has begun, thoughts of your baby, as well as his or her sucking or crying, can stimulate the release of milk from your milk-producing glands. Benefits of breastfeeding For Your Baby  Your first milk (colostrum) helps your baby's digestive system function better.  There are antibodies in your milk that help your baby fight off infections.  Your baby has a lower incidence of asthma, allergies, and sudden infant death syndrome.  The nutrients in breast milk are better for your baby than infant formulas and are designed uniquely for your baby's needs.  Breast milk improves your baby's brain development.  Your baby is less likely to develop other conditions, such as childhood obesity, asthma, or type 2 diabetes mellitus.  For You  Breastfeeding helps to create a very special bond between you and your baby.  Breastfeeding is convenient. Breast milk is always available at the correct temperature and costs nothing.  Breastfeeding helps to burn calories and helps you lose the weight gained during pregnancy.  Breastfeeding makes your uterus contract to its prepregnancy size faster and slows bleeding (lochia) after you give birth.  Breastfeeding helps to lower your risk of developing type 2 diabetes mellitus, osteoporosis, and breast or ovarian cancer later in life.  Signs that your baby is hungry Early Signs of Hunger  Increased alertness or activity.  Stretching.  Movement of the head from side to  side.  Movement of the head and opening of the mouth when the corner of the mouth or cheek is stroked (rooting).  Increased sucking sounds, smacking lips, cooing, sighing, or squeaking.  Hand-to-mouth movements.  Increased sucking of fingers or hands.  Late Signs of Hunger  Fussing.  Intermittent crying.  Extreme Signs of Hunger Signs of extreme hunger will require calming and consoling before your baby will be able to breastfeed successfully. Do not wait for the following signs of extreme hunger to occur before you initiate breastfeeding:  Restlessness.  A loud, strong cry.  Screaming.  Breastfeeding basics Breastfeeding Initiation  Find a comfortable place to sit or lie down, with your neck and back well supported.  Place a pillow or rolled up blanket under your baby to bring him or her to the level of your breast (if you are seated). Nursing pillows are specially designed to help support your arms and your baby while you breastfeed.  Make sure that your baby's abdomen is facing your abdomen.  Gently massage your breast. With your fingertips, massage from your chest wall toward your nipple in a circular motion. This encourages milk flow. You may need to continue this action during the feeding if your milk flows slowly.  Support your breast with 4 fingers underneath and your thumb above your nipple. Make sure your fingers are well away from your nipple and your baby's mouth.  Stroke your baby's lips gently with your finger or nipple.  When your baby's mouth is open wide enough, quickly bring your baby to your breast, placing your entire nipple and as much of the colored area   around your nipple (areola) as possible into your baby's mouth. ? More areola should be visible above your baby's upper lip than below the lower lip. ? Your baby's tongue should be between his or her lower gum and your breast.  Ensure that your baby's mouth is correctly positioned around your nipple  (latched). Your baby's lips should create a seal on your breast and be turned out (everted).  It is common for your baby to suck about 2-3 minutes in order to start the flow of breast milk.  Latching Teaching your baby how to latch on to your breast properly is very important. An improper latch can cause nipple pain and decreased milk supply for you and poor weight gain in your baby. Also, if your baby is not latched onto your nipple properly, he or she may swallow some air during feeding. This can make your baby fussy. Burping your baby when you switch breasts during the feeding can help to get rid of the air. However, teaching your baby to latch on properly is still the best way to prevent fussiness from swallowing air while breastfeeding. Signs that your baby has successfully latched on to your nipple:  Silent tugging or silent sucking, without causing you pain.  Swallowing heard between every 3-4 sucks.  Muscle movement above and in front of his or her ears while sucking.  Signs that your baby has not successfully latched on to nipple:  Sucking sounds or smacking sounds from your baby while breastfeeding.  Nipple pain.  If you think your baby has not latched on correctly, slip your finger into the corner of your baby's mouth to break the suction and place it between your baby's gums. Attempt breastfeeding initiation again. Signs of Successful Breastfeeding Signs from your baby:  A gradual decrease in the number of sucks or complete cessation of sucking.  Falling asleep.  Relaxation of his or her body.  Retention of a small amount of milk in his or her mouth.  Letting go of your breast by himself or herself.  Signs from you:  Breasts that have increased in firmness, weight, and size 1-3 hours after feeding.  Breasts that are softer immediately after breastfeeding.  Increased milk volume, as well as a change in milk consistency and color by the fifth day of  breastfeeding.  Nipples that are not sore, cracked, or bleeding.  Signs That Your Baby is Getting Enough Milk  Wetting at least 1-2 diapers during the first 24 hours after birth.  Wetting at least 5-6 diapers every 24 hours for the first week after birth. The urine should be clear or pale yellow by 5 days after birth.  Wetting 6-8 diapers every 24 hours as your baby continues to grow and develop.  At least 3 stools in a 24-hour period by age 5 days. The stool should be soft and yellow.  At least 3 stools in a 24-hour period by age 7 days. The stool should be seedy and yellow.  No loss of weight greater than 10% of birth weight during the first 3 days of age.  Average weight gain of 4-7 ounces (113-198 g) per week after age 4 days.  Consistent daily weight gain by age 5 days, without weight loss after the age of 2 weeks.  After a feeding, your baby may spit up a small amount. This is common. Breastfeeding frequency and duration Frequent feeding will help you make more milk and can prevent sore nipples and breast engorgement. Breastfeed when   you feel the need to reduce the fullness of your breasts or when your baby shows signs of hunger. This is called "breastfeeding on demand." Avoid introducing a pacifier to your baby while you are working to establish breastfeeding (the first 4-6 weeks after your baby is born). After this time you may choose to use a pacifier. Research has shown that pacifier use during the first year of a baby's life decreases the risk of sudden infant death syndrome (SIDS). Allow your baby to feed on each breast as long as he or she wants. Breastfeed until your baby is finished feeding. When your baby unlatches or falls asleep while feeding from the first breast, offer the second breast. Because newborns are often sleepy in the first few weeks of life, you may need to awaken your baby to get him or her to feed. Breastfeeding times will vary from baby to baby. However,  the following rules can serve as a guide to help you ensure that your baby is properly fed:  Newborns (babies 4 weeks of age or younger) may breastfeed every 1-3 hours.  Newborns should not go longer than 3 hours during the day or 5 hours during the night without breastfeeding.  You should breastfeed your baby a minimum of 8 times in a 24-hour period until you begin to introduce solid foods to your baby at around 6 months of age.  Breast milk pumping Pumping and storing breast milk allows you to ensure that your baby is exclusively fed your breast milk, even at times when you are unable to breastfeed. This is especially important if you are going back to work while you are still breastfeeding or when you are not able to be present during feedings. Your lactation consultant can give you guidelines on how long it is safe to store breast milk. A breast pump is a machine that allows you to pump milk from your breast into a sterile bottle. The pumped breast milk can then be stored in a refrigerator or freezer. Some breast pumps are operated by hand, while others use electricity. Ask your lactation consultant which type will work best for you. Breast pumps can be purchased, but some hospitals and breastfeeding support groups lease breast pumps on a monthly basis. A lactation consultant can teach you how to hand express breast milk, if you prefer not to use a pump. Caring for your breasts while you breastfeed Nipples can become dry, cracked, and sore while breastfeeding. The following recommendations can help keep your breasts moisturized and healthy:  Avoid using soap on your nipples.  Wear a supportive bra. Although not required, special nursing bras and tank tops are designed to allow access to your breasts for breastfeeding without taking off your entire bra or top. Avoid wearing underwire-style bras or extremely tight bras.  Air dry your nipples for 3-4minutes after each feeding.  Use only cotton  bra pads to absorb leaked breast milk. Leaking of breast milk between feedings is normal.  Use lanolin on your nipples after breastfeeding. Lanolin helps to maintain your skin's normal moisture barrier. If you use pure lanolin, you do not need to wash it off before feeding your baby again. Pure lanolin is not toxic to your baby. You may also hand express a few drops of breast milk and gently massage that milk into your nipples and allow the milk to air dry.  In the first few weeks after giving birth, some women experience extremely full breasts (engorgement). Engorgement can make your   breasts feel heavy, warm, and tender to the touch. Engorgement peaks within 3-5 days after you give birth. The following recommendations can help ease engorgement:  Completely empty your breasts while breastfeeding or pumping. You may want to start by applying warm, moist heat (in the shower or with warm water-soaked hand towels) just before feeding or pumping. This increases circulation and helps the milk flow. If your baby does not completely empty your breasts while breastfeeding, pump any extra milk after he or she is finished.  Wear a snug bra (nursing or regular) or tank top for 1-2 days to signal your body to slightly decrease milk production.  Apply ice packs to your breasts, unless this is too uncomfortable for you.  Make sure that your baby is latched on and positioned properly while breastfeeding.  If engorgement persists after 48 hours of following these recommendations, contact your health care provider or a lactation consultant. Overall health care recommendations while breastfeeding  Eat healthy foods. Alternate between meals and snacks, eating 3 of each per day. Because what you eat affects your breast milk, some of the foods may make your baby more irritable than usual. Avoid eating these foods if you are sure that they are negatively affecting your baby.  Drink milk, fruit juice, and water to  satisfy your thirst (about 10 glasses a day).  Rest often, relax, and continue to take your prenatal vitamins to prevent fatigue, stress, and anemia.  Continue breast self-awareness checks.  Avoid chewing and smoking tobacco. Chemicals from cigarettes that pass into breast milk and exposure to secondhand smoke may harm your baby.  Avoid alcohol and drug use, including marijuana. Some medicines that may be harmful to your baby can pass through breast milk. It is important to ask your health care provider before taking any medicine, including all over-the-counter and prescription medicine as well as vitamin and herbal supplements. It is possible to become pregnant while breastfeeding. If birth control is desired, ask your health care provider about options that will be safe for your baby. Contact a health care provider if:  You feel like you want to stop breastfeeding or have become frustrated with breastfeeding.  You have painful breasts or nipples.  Your nipples are cracked or bleeding.  Your breasts are red, tender, or warm.  You have a swollen area on either breast.  You have a fever or chills.  You have nausea or vomiting.  You have drainage other than breast milk from your nipples.  Your breasts do not become full before feedings by the fifth day after you give birth.  You feel sad and depressed.  Your baby is too sleepy to eat well.  Your baby is having trouble sleeping.  Your baby is wetting less than 3 diapers in a 24-hour period.  Your baby has less than 3 stools in a 24-hour period.  Your baby's skin or the white part of his or her eyes becomes yellow.  Your baby is not gaining weight by 5 days of age. Get help right away if:  Your baby is overly tired (lethargic) and does not want to wake up and feed.  Your baby develops an unexplained fever. This information is not intended to replace advice given to you by your health care provider. Make sure you discuss  any questions you have with your health care provider. Document Released: 01/19/2005 Document Revised: 07/03/2015 Document Reviewed: 07/13/2012 Elsevier Interactive Patient Education  2017 [employer]  

## 2016-11-10 NOTE — Progress Notes (Signed)

## 2016-11-11 LAB — GC/CHLAMYDIA PROBE AMP (~~LOC~~) NOT AT ARMC
Chlamydia: NEGATIVE
Neisseria Gonorrhea: NEGATIVE

## 2016-11-13 ENCOUNTER — Encounter: Payer: Self-pay | Admitting: Family Medicine

## 2016-11-13 DIAGNOSIS — O99820 Streptococcus B carrier state complicating pregnancy: Secondary | ICD-10-CM | POA: Insufficient documentation

## 2016-11-13 LAB — CULTURE, BETA STREP (GROUP B ONLY): STREP GP B CULTURE: POSITIVE — AB

## 2016-11-17 ENCOUNTER — Ambulatory Visit: Payer: Self-pay

## 2016-11-17 ENCOUNTER — Other Ambulatory Visit: Payer: Self-pay

## 2016-11-17 ENCOUNTER — Ambulatory Visit (INDEPENDENT_AMBULATORY_CARE_PROVIDER_SITE_OTHER): Payer: Self-pay | Admitting: *Deleted

## 2016-11-17 ENCOUNTER — Ambulatory Visit (INDEPENDENT_AMBULATORY_CARE_PROVIDER_SITE_OTHER): Payer: Self-pay | Admitting: Family Medicine

## 2016-11-17 VITALS — BP 133/85 | HR 89 | Wt 147.2 lb

## 2016-11-17 DIAGNOSIS — Z758 Other problems related to medical facilities and other health care: Secondary | ICD-10-CM

## 2016-11-17 DIAGNOSIS — O24415 Gestational diabetes mellitus in pregnancy, controlled by oral hypoglycemic drugs: Secondary | ICD-10-CM

## 2016-11-17 DIAGNOSIS — O09219 Supervision of pregnancy with history of pre-term labor, unspecified trimester: Secondary | ICD-10-CM

## 2016-11-17 DIAGNOSIS — Z789 Other specified health status: Secondary | ICD-10-CM

## 2016-11-17 DIAGNOSIS — O99820 Streptococcus B carrier state complicating pregnancy: Secondary | ICD-10-CM

## 2016-11-17 DIAGNOSIS — O0990 Supervision of high risk pregnancy, unspecified, unspecified trimester: Secondary | ICD-10-CM

## 2016-11-17 DIAGNOSIS — O09899 Supervision of other high risk pregnancies, unspecified trimester: Secondary | ICD-10-CM

## 2016-11-17 DIAGNOSIS — Z8759 Personal history of other complications of pregnancy, childbirth and the puerperium: Secondary | ICD-10-CM

## 2016-11-17 DIAGNOSIS — O09523 Supervision of elderly multigravida, third trimester: Secondary | ICD-10-CM

## 2016-11-17 NOTE — Progress Notes (Signed)
   PRENATAL VISIT NOTE  Subjective:  Monique Hutchinson is a 41 y.o. G3P1102 at [redacted]w[redacted]d being seen today for ongoing prenatal care.  She is currently monitored for the following issues for this high-risk pregnancy and has Advanced maternal age in multigravida; History of prior pregnancy with SGA newborn; Onychomycosis; History of preterm delivery, currently pregnant; History of VBAC; Language barrier; Supervision of high risk pregnancy, antepartum; Low grade squamous intraepith lesion on cytologic smear cervix (lgsil); History of severe pre-eclampsia; Gestational diabetes; and Group B Streptococcus carrier, +RV culture, currently pregnant on her problem list.  Patient reports no complaints.  Contractions: Irregular. Vag. Bleeding: None.  Movement: Present. Denies leaking of fluid.   She was started on glyburide last visit, which she reports she is tolerating well. Brings her glucose log, which shows CBG all at goal but one postprandial of 126 since starting glyburide.  The following portions of the patient's history were reviewed and updated as appropriate: allergies, current medications, past family history, past medical history, past social history, past surgical history and problem list. Problem list updated.  Objective:   Vitals:   11/17/16 1259  BP: 133/85  Pulse: 89  Weight: 147 lb 3.2 oz (66.8 kg)    Fetal Status: Fetal Heart Rate (bpm): NST   Movement: Present     General:  Alert, oriented and cooperative. Patient is in no acute distress.  Skin: Skin is warm and dry. No rash noted.   Cardiovascular: Normal heart rate noted  Respiratory: Normal respiratory effort, no problems with respiration noted  Abdomen: Soft, gravid, appropriate for gestational age.  Pain/Pressure: Absent     Pelvic: Cervical exam deferred        Extremities: Normal range of motion.  Edema: Trace  Mental Status:  Normal mood and affect. Normal behavior. Normal judgment and thought content.   Assessment and  Plan:  Pregnancy: G3P1102 at [redacted]w[redacted]d  1. Supervision of high risk pregnancy, antepartum  2. Gestational diabetes mellitus (GDM) in third trimester controlled on oral hypoglycemic drug Started on glyburide 1.25 mg daily last visit  CBG now at goal.  - Continue glyburide 1.25 mg daily - BPP today 8/10, Reactive NST. Continue weekly antenatal testing  3. History of preterm delivery, currently pregnant - Completed weekly 17-P  4. Elderly multigravida in third trimester Seen by MFM this pregnancy. Had negative quad screen.   5. History of severe pre-eclampsia - On daily ASA 81 mg. Advised she may stop this now.  6. Group B Streptococcus carrier, +RV culture, currently pregnant - Will need IAP  7. Language barrier - Interpreter, Lilia, used via [employer]  Preterm labor symptoms and general obstetric precautions including but not limited to vaginal bleeding, contractions, leaking of fluid and fetal movement were reviewed in detail with the patient. Please refer to After Visit Summary for other counseling recommendations.  Return in about 6 days (around 11/23/2016) for 10/22, 23 or 24 for  NST/BPP & HOB.   Frederik Pear, MD   Future Appointments Date Time Provider Department Center  11/26/2016 8:40 AM [facility]  11/26/2016 9:40 AM Conan Bowens, MD [facility]

## 2016-11-17 NOTE — Progress Notes (Signed)

## 2016-11-24 ENCOUNTER — Ambulatory Visit: Payer: Self-pay

## 2016-11-26 ENCOUNTER — Telehealth (HOSPITAL_COMMUNITY): Payer: Self-pay | Admitting: *Deleted

## 2016-11-26 ENCOUNTER — Encounter (HOSPITAL_COMMUNITY): Payer: Self-pay | Admitting: *Deleted

## 2016-11-26 ENCOUNTER — Ambulatory Visit: Payer: Self-pay

## 2016-11-26 ENCOUNTER — Ambulatory Visit (INDEPENDENT_AMBULATORY_CARE_PROVIDER_SITE_OTHER): Payer: Self-pay | Admitting: *Deleted

## 2016-11-26 ENCOUNTER — Ambulatory Visit (INDEPENDENT_AMBULATORY_CARE_PROVIDER_SITE_OTHER): Payer: Self-pay | Admitting: Obstetrics and Gynecology

## 2016-11-26 VITALS — BP 128/84 | HR 83 | Wt 148.1 lb

## 2016-11-26 DIAGNOSIS — O99820 Streptococcus B carrier state complicating pregnancy: Secondary | ICD-10-CM

## 2016-11-26 DIAGNOSIS — Z98891 History of uterine scar from previous surgery: Secondary | ICD-10-CM

## 2016-11-26 DIAGNOSIS — O0990 Supervision of high risk pregnancy, unspecified, unspecified trimester: Secondary | ICD-10-CM

## 2016-11-26 DIAGNOSIS — O24415 Gestational diabetes mellitus in pregnancy, controlled by oral hypoglycemic drugs: Secondary | ICD-10-CM

## 2016-11-26 DIAGNOSIS — O09899 Supervision of other high risk pregnancies, unspecified trimester: Secondary | ICD-10-CM

## 2016-11-26 DIAGNOSIS — O0993 Supervision of high risk pregnancy, unspecified, third trimester: Secondary | ICD-10-CM

## 2016-11-26 DIAGNOSIS — R87612 Low grade squamous intraepithelial lesion on cytologic smear of cervix (LGSIL): Secondary | ICD-10-CM

## 2016-11-26 DIAGNOSIS — Z8759 Personal history of other complications of pregnancy, childbirth and the puerperium: Secondary | ICD-10-CM

## 2016-11-26 DIAGNOSIS — O09219 Supervision of pregnancy with history of pre-term labor, unspecified trimester: Secondary | ICD-10-CM

## 2016-11-26 DIAGNOSIS — O09523 Supervision of elderly multigravida, third trimester: Secondary | ICD-10-CM

## 2016-11-26 DIAGNOSIS — O09213 Supervision of pregnancy with history of pre-term labor, third trimester: Secondary | ICD-10-CM

## 2016-11-26 LAB — POCT URINALYSIS DIP (DEVICE)
BILIRUBIN URINE: NEGATIVE
GLUCOSE, UA: NEGATIVE mg/dL
Ketones, ur: NEGATIVE mg/dL
LEUKOCYTES UA: NEGATIVE
NITRITE: NEGATIVE
Protein, ur: NEGATIVE mg/dL
SPECIFIC GRAVITY, URINE: 1.01 (ref 1.005–1.030)
UROBILINOGEN UA: 0.2 mg/dL (ref 0.0–1.0)
pH: 6 (ref 5.0–8.0)

## 2016-11-26 NOTE — Progress Notes (Signed)
Scheduled for IOL 12/03/16 0730 per order.

## 2016-11-26 NOTE — Telephone Encounter (Signed)
[phone] interpreter number  Preadmission screen

## 2016-11-26 NOTE — Progress Notes (Signed)
Interpreter Monique Hutchinson present for encounter.    Pt informed that the ultrasound is considered a limited OB ultrasound and is not intended to be a complete ultrasound exam.  Patient also informed that the ultrasound is not being completed with the intent of assessing for fetal or placental anomalies or any pelvic abnormalities.  Explained that the purpose of today's ultrasound is to assess for presentation, BPP and amniotic fluid volume.  Patient acknowledges the purpose of the exam and the limitations of the study.

## 2016-11-26 NOTE — Progress Notes (Signed)
   PRENATAL VISIT NOTE  Subjective:  Monique Hutchinson is a 41 y.o. G3P1102 at 6529w0d being seen today for ongoing prenatal care.  She is currently monitored for the following issues for this high-risk pregnancy and has Advanced maternal age in multigravida; History of prior pregnancy with SGA newborn; Onychomycosis; History of preterm delivery, currently pregnant; History of VBAC; Language barrier; Supervision of high risk pregnancy, antepartum; Low grade squamous intraepith lesion on cytologic smear cervix (lgsil); History of severe pre-eclampsia; Gestational diabetes; and Group B Streptococcus carrier, +RV culture, currently pregnant on her problem list.  Patient reports no complaints.  Contractions: Irregular. Vag. Bleeding: Scant.  Movement: Present. Denies leaking of fluid.   The following portions of the patient's history were reviewed and updated as appropriate: allergies, current medications, past family history, past medical history, past social history, past surgical history and problem list. Problem list updated.  Objective:   Vitals:   11/26/16 0857  BP: 128/84  Pulse: 83  Weight: 148 lb 1.6 oz (67.2 kg)    Fetal Status: Fetal Heart Rate (bpm): NST   Movement: Present     General:  Alert, oriented and cooperative. Patient is in no acute distress.  Skin: Skin is warm and dry. No rash noted.   Cardiovascular: Normal heart rate noted  Respiratory: Normal respiratory effort, no problems with respiration noted  Abdomen: Soft, gravid, appropriate for gestational age.  Pain/Pressure: Present     Pelvic: Cervical exam deferred        Extremities: Normal range of motion.  Edema: None  Mental Status:  Normal mood and affect. Normal behavior. Normal judgment and thought content.   Assessment and Plan:  Pregnancy: G3P1102 at 5829w0d  1. Supervision of high risk pregnancy, antepartum Last growth 74th%ile (10/2) IOL scheduled for 39 weeks  2. Gestational diabetes mellitus (GDM) in  third trimester controlled on oral hypoglycemic drug Glyburide 1.25 mg daily Well controlled  3. History of preterm delivery, currently pregnant S/p 17 P  4. Elderly multigravida in third trimester  5. History of VBAC For TOLAC   6. Low grade squamous intraepith lesion on cytologic smear cervix (lgsil) colpo PP  7. History of severe pre-eclampsia S/p ASA 81 mg  8. Group B Streptococcus carrier, +RV culture, currently pregnant ppx in labor  Term labor symptoms and general obstetric precautions including but not limited to vaginal bleeding, contractions, leaking of fluid and fetal movement were reviewed in detail with the patient. Please refer to After Visit Summary for other counseling recommendations.  Return in about 4 weeks (around 12/24/2016).   Conan BowensKelly M Migel Hannis, MD

## 2016-11-27 ENCOUNTER — Other Ambulatory Visit: Payer: Self-pay | Admitting: Advanced Practice Midwife

## 2016-11-27 ENCOUNTER — Encounter: Payer: Self-pay | Admitting: Family Medicine

## 2016-12-01 ENCOUNTER — Ambulatory Visit: Payer: Self-pay

## 2016-12-03 ENCOUNTER — Inpatient Hospital Stay (HOSPITAL_COMMUNITY): Payer: Medicaid Other | Admitting: Anesthesiology

## 2016-12-03 ENCOUNTER — Encounter (HOSPITAL_COMMUNITY): Payer: Self-pay

## 2016-12-03 ENCOUNTER — Encounter (HOSPITAL_COMMUNITY)
Admission: RE | Disposition: A | Discharge status: Home / Self Care | Payer: Self-pay | Source: Ambulatory Visit | Attending: Obstetrics and Gynecology

## 2016-12-03 ENCOUNTER — Inpatient Hospital Stay (HOSPITAL_COMMUNITY)
Admission: RE | Admit: 2016-12-03 | Discharge: 2016-12-05 | DRG: 786 | Disposition: A | Discharge status: Home / Self Care | Payer: Medicaid Other | Source: Ambulatory Visit | Attending: Obstetrics and Gynecology | Admitting: Obstetrics and Gynecology

## 2016-12-03 VITALS — BP 99/59 | HR 81 | Temp 98.1°F | Resp 16 | Ht <= 58 in | Wt 148.0 lb

## 2016-12-03 DIAGNOSIS — Z8759 Personal history of other complications of pregnancy, childbirth and the puerperium: Secondary | ICD-10-CM

## 2016-12-03 DIAGNOSIS — O99214 Obesity complicating childbirth: Secondary | ICD-10-CM | POA: Diagnosis present

## 2016-12-03 DIAGNOSIS — O99820 Streptococcus B carrier state complicating pregnancy: Secondary | ICD-10-CM

## 2016-12-03 DIAGNOSIS — O99824 Streptococcus B carrier state complicating childbirth: Secondary | ICD-10-CM | POA: Diagnosis present

## 2016-12-03 DIAGNOSIS — Z3A39 39 weeks gestation of pregnancy: Secondary | ICD-10-CM

## 2016-12-03 DIAGNOSIS — O4593 Premature separation of placenta, unspecified, third trimester: Secondary | ICD-10-CM | POA: Diagnosis present

## 2016-12-03 DIAGNOSIS — O34211 Maternal care for low transverse scar from previous cesarean delivery: Secondary | ICD-10-CM | POA: Diagnosis present

## 2016-12-03 DIAGNOSIS — B351 Tinea unguium: Secondary | ICD-10-CM

## 2016-12-03 DIAGNOSIS — Z758 Other problems related to medical facilities and other health care: Secondary | ICD-10-CM

## 2016-12-03 DIAGNOSIS — O09219 Supervision of pregnancy with history of pre-term labor, unspecified trimester: Secondary | ICD-10-CM

## 2016-12-03 DIAGNOSIS — K219 Gastro-esophageal reflux disease without esophagitis: Secondary | ICD-10-CM | POA: Diagnosis present

## 2016-12-03 DIAGNOSIS — Z789 Other specified health status: Secondary | ICD-10-CM

## 2016-12-03 DIAGNOSIS — O0990 Supervision of high risk pregnancy, unspecified, unspecified trimester: Secondary | ICD-10-CM

## 2016-12-03 DIAGNOSIS — O09523 Supervision of elderly multigravida, third trimester: Secondary | ICD-10-CM

## 2016-12-03 DIAGNOSIS — O24415 Gestational diabetes mellitus in pregnancy, controlled by oral hypoglycemic drugs: Secondary | ICD-10-CM

## 2016-12-03 DIAGNOSIS — O09899 Supervision of other high risk pregnancies, unspecified trimester: Secondary | ICD-10-CM

## 2016-12-03 DIAGNOSIS — O24425 Gestational diabetes mellitus in childbirth, controlled by oral hypoglycemic drugs: Secondary | ICD-10-CM | POA: Diagnosis present

## 2016-12-03 DIAGNOSIS — Z98891 History of uterine scar from previous surgery: Secondary | ICD-10-CM

## 2016-12-03 DIAGNOSIS — O24419 Gestational diabetes mellitus in pregnancy, unspecified control: Secondary | ICD-10-CM | POA: Diagnosis present

## 2016-12-03 DIAGNOSIS — O24429 Gestational diabetes mellitus in childbirth, unspecified control: Secondary | ICD-10-CM | POA: Diagnosis not present

## 2016-12-03 DIAGNOSIS — R87612 Low grade squamous intraepithelial lesion on cytologic smear of cervix (LGSIL): Secondary | ICD-10-CM

## 2016-12-03 DIAGNOSIS — E669 Obesity, unspecified: Secondary | ICD-10-CM | POA: Diagnosis present

## 2016-12-03 DIAGNOSIS — O9962 Diseases of the digestive system complicating childbirth: Secondary | ICD-10-CM | POA: Diagnosis present

## 2016-12-03 LAB — CBC
HEMATOCRIT: 40.1 % (ref 36.0–46.0)
HEMOGLOBIN: 14.1 g/dL (ref 12.0–15.0)
MCH: 31.3 pg (ref 26.0–34.0)
MCHC: 35.2 g/dL (ref 30.0–36.0)
MCV: 88.9 fL (ref 78.0–100.0)
Platelets: 227 10*3/uL (ref 150–400)
RBC: 4.51 MIL/uL (ref 3.87–5.11)
RDW: 13.1 % (ref 11.5–15.5)
WBC: 10.3 10*3/uL (ref 4.0–10.5)

## 2016-12-03 LAB — TYPE AND SCREEN
ABO/RH(D): O POS
Antibody Screen: NEGATIVE

## 2016-12-03 LAB — RPR: RPR: NONREACTIVE

## 2016-12-03 LAB — GLUCOSE, RANDOM: GLUCOSE: 75 mg/dL (ref 65–99)

## 2016-12-03 SURGERY — Surgical Case
Anesthesia: Epidural

## 2016-12-03 MED ORDER — LIDOCAINE HCL (PF) 1 % IJ SOLN: 30.0000 mL | INTRAMUSCULAR | Status: DC | PRN

## 2016-12-03 MED ORDER — SIMETHICONE 80 MG PO CHEW
80.0000 mg | CHEWABLE_TABLET | Freq: Three times a day (TID) | ORAL | Status: DC
  Administered 2016-12-04 – 2016-12-05 (×5): 80 mg via ORAL
  Filled 2016-12-03 (×5): qty 1

## 2016-12-03 MED ORDER — TERBUTALINE SULFATE 1 MG/ML IJ SOLN
0.2500 mg | Freq: Once | INTRAMUSCULAR | Status: AC | PRN
  Administered 2016-12-03: 0.25 mg via SUBCUTANEOUS
  Filled 2016-12-03: qty 1

## 2016-12-03 MED ORDER — CEFAZOLIN SODIUM-DEXTROSE 2-3 GM-%(50ML) IV SOLR
INTRAVENOUS | Status: DC | PRN
  Administered 2016-12-03: 2 g via INTRAVENOUS

## 2016-12-03 MED ORDER — PROMETHAZINE HCL 25 MG/ML IJ SOLN: 6.2500 mg | INTRAMUSCULAR | Status: DC | PRN

## 2016-12-03 MED ORDER — SCOPOLAMINE 1 MG/3DAYS TD PT72
MEDICATED_PATCH | TRANSDERMAL | Status: AC
  Filled 2016-12-03: qty 1

## 2016-12-03 MED ORDER — PHENYLEPHRINE 40 MCG/ML (10ML) SYRINGE FOR IV PUSH (FOR BLOOD PRESSURE SUPPORT): 80.0000 ug | PREFILLED_SYRINGE | INTRAVENOUS | Status: DC | PRN

## 2016-12-03 MED ORDER — SCOPOLAMINE 1 MG/3DAYS TD PT72
MEDICATED_PATCH | TRANSDERMAL | Status: DC | PRN
  Administered 2016-12-03: 1 via TRANSDERMAL

## 2016-12-03 MED ORDER — HYDROMORPHONE HCL 1 MG/ML IJ SOLN: 0.2500 mg | INTRAMUSCULAR | Status: DC | PRN

## 2016-12-03 MED ORDER — OXYCODONE HCL 5 MG/5ML PO SOLN: 5.0000 mg | Freq: Once | ORAL | Status: DC | PRN

## 2016-12-03 MED ORDER — TETANUS-DIPHTH-ACELL PERTUSSIS 5-2.5-18.5 LF-MCG/0.5 IM SUSP: 0.5000 mL | Freq: Once | INTRAMUSCULAR | Status: DC

## 2016-12-03 MED ORDER — LACTATED RINGERS IV SOLN: 500.0000 mL | Freq: Once | INTRAVENOUS | Status: DC

## 2016-12-03 MED ORDER — PHENYLEPHRINE 40 MCG/ML (10ML) SYRINGE FOR IV PUSH (FOR BLOOD PRESSURE SUPPORT)
80.0000 ug | PREFILLED_SYRINGE | INTRAVENOUS | Status: DC | PRN
  Filled 2016-12-03: qty 10

## 2016-12-03 MED ORDER — MORPHINE SULFATE (PF) 0.5 MG/ML IJ SOLN
INTRAMUSCULAR | Status: DC | PRN
  Administered 2016-12-03: 4 mg via EPIDURAL
  Administered 2016-12-03: 1 mg via EPIDURAL

## 2016-12-03 MED ORDER — OXYCODONE-ACETAMINOPHEN 5-325 MG PO TABS: 2.0000 | ORAL_TABLET | ORAL | Status: DC | PRN

## 2016-12-03 MED ORDER — LIDOCAINE HCL (PF) 1 % IJ SOLN
INTRAMUSCULAR | Status: DC | PRN
  Administered 2016-12-03: 4 mL via EPIDURAL
  Administered 2016-12-03: 3 mL via EPIDURAL

## 2016-12-03 MED ORDER — DIPHENHYDRAMINE HCL 50 MG/ML IJ SOLN: 12.5000 mg | INTRAMUSCULAR | Status: DC | PRN

## 2016-12-03 MED ORDER — OXYCODONE HCL 5 MG PO TABS: 10.0000 mg | ORAL_TABLET | ORAL | Status: DC | PRN

## 2016-12-03 MED ORDER — PHENYLEPHRINE 40 MCG/ML (10ML) SYRINGE FOR IV PUSH (FOR BLOOD PRESSURE SUPPORT)
PREFILLED_SYRINGE | INTRAVENOUS | Status: AC
  Filled 2016-12-03: qty 20

## 2016-12-03 MED ORDER — OXYCODONE-ACETAMINOPHEN 5-325 MG PO TABS: 1.0000 | ORAL_TABLET | ORAL | Status: DC | PRN

## 2016-12-03 MED ORDER — CEFAZOLIN SODIUM-DEXTROSE 2-3 GM-%(50ML) IV SOLR
INTRAVENOUS | Status: AC
  Filled 2016-12-03: qty 50

## 2016-12-03 MED ORDER — MORPHINE SULFATE (PF) 0.5 MG/ML IJ SOLN
INTRAMUSCULAR | Status: AC
  Filled 2016-12-03: qty 10

## 2016-12-03 MED ORDER — ACETAMINOPHEN 325 MG PO TABS
650.0000 mg | ORAL_TABLET | ORAL | Status: DC | PRN
  Administered 2016-12-05: 650 mg via ORAL
  Filled 2016-12-03: qty 2

## 2016-12-03 MED ORDER — DIBUCAINE 1 % RE OINT: 1.0000 "application " | TOPICAL_OINTMENT | RECTAL | Status: DC | PRN

## 2016-12-03 MED ORDER — DEXAMETHASONE SODIUM PHOSPHATE 10 MG/ML IJ SOLN
INTRAMUSCULAR | Status: DC | PRN
  Administered 2016-12-03: 10 mg via INTRAVENOUS

## 2016-12-03 MED ORDER — ONDANSETRON HCL 4 MG/2ML IJ SOLN: 4.0000 mg | Freq: Four times a day (QID) | INTRAMUSCULAR | Status: DC | PRN

## 2016-12-03 MED ORDER — LACTATED RINGERS IV SOLN
INTRAVENOUS | Status: DC
  Administered 2016-12-04: 01:00:00 via INTRAVENOUS

## 2016-12-03 MED ORDER — LACTATED RINGERS IV SOLN
INTRAVENOUS | Status: DC
  Administered 2016-12-03 (×4): via INTRAVENOUS

## 2016-12-03 MED ORDER — PHENYLEPHRINE HCL 10 MG/ML IJ SOLN
INTRAMUSCULAR | Status: DC | PRN
  Administered 2016-12-03 (×2): 120 ug via INTRAVENOUS
  Administered 2016-12-03 (×2): 80 ug via INTRAVENOUS
  Administered 2016-12-03: 120 ug via INTRAVENOUS
  Administered 2016-12-03: 80 ug via INTRAVENOUS

## 2016-12-03 MED ORDER — OXYTOCIN BOLUS FROM INFUSION: 500.0000 mL | Freq: Once | INTRAVENOUS | Status: DC

## 2016-12-03 MED ORDER — ONDANSETRON HCL 4 MG/2ML IJ SOLN
INTRAMUSCULAR | Status: AC
  Filled 2016-12-03: qty 2

## 2016-12-03 MED ORDER — PENICILLIN G POT IN DEXTROSE 60000 UNIT/ML IV SOLN
3.0000 10*6.[IU] | INTRAVENOUS | Status: DC
  Administered 2016-12-03 (×2): 3 10*6.[IU] via INTRAVENOUS
  Filled 2016-12-03 (×4): qty 50

## 2016-12-03 MED ORDER — OXYTOCIN 40 UNITS IN LACTATED RINGERS INFUSION - SIMPLE MED
2.5000 [IU]/h | INTRAVENOUS | Status: DC
  Filled 2016-12-03: qty 1000

## 2016-12-03 MED ORDER — SODIUM CHLORIDE 0.9 % IR SOLN
Status: DC | PRN
  Administered 2016-12-03: 1

## 2016-12-03 MED ORDER — PHENYLEPHRINE 40 MCG/ML (10ML) SYRINGE FOR IV PUSH (FOR BLOOD PRESSURE SUPPORT)
PREFILLED_SYRINGE | INTRAVENOUS | Status: DC | PRN
  Administered 2016-12-03: 80 ug via INTRAVENOUS

## 2016-12-03 MED ORDER — OXYTOCIN 40 UNITS IN LACTATED RINGERS INFUSION - SIMPLE MED: 2.5000 [IU]/h | INTRAVENOUS | Status: AC

## 2016-12-03 MED ORDER — PRENATAL MULTIVITAMIN CH
1.0000 | ORAL_TABLET | Freq: Every day | ORAL | Status: DC
  Administered 2016-12-04 – 2016-12-05 (×2): 1 via ORAL
  Filled 2016-12-03 (×2): qty 1

## 2016-12-03 MED ORDER — SODIUM BICARBONATE 8.4 % IV SOLN
INTRAVENOUS | Status: DC | PRN
  Administered 2016-12-03: 10 mL via EPIDURAL

## 2016-12-03 MED ORDER — ACETAMINOPHEN 325 MG PO TABS: 650.0000 mg | ORAL_TABLET | ORAL | Status: DC | PRN

## 2016-12-03 MED ORDER — FENTANYL 2.5 MCG/ML BUPIVACAINE 1/10 % EPIDURAL INFUSION (WH - ANES)
14.0000 mL/h | INTRAMUSCULAR | Status: DC | PRN
Start: 2016-12-03 — End: 2016-12-03
  Administered 2016-12-03: 10 mL/h via EPIDURAL
  Filled 2016-12-03: qty 100

## 2016-12-03 MED ORDER — OXYTOCIN 10 UNIT/ML IJ SOLN
INTRAMUSCULAR | Status: AC
  Filled 2016-12-03: qty 4

## 2016-12-03 MED ORDER — SOD CITRATE-CITRIC ACID 500-334 MG/5ML PO SOLN: 30.0000 mL | ORAL | Status: DC | PRN

## 2016-12-03 MED ORDER — AZITHROMYCIN 500 MG IV SOLR
500.0000 mg | Freq: Once | INTRAVENOUS | Status: AC
  Administered 2016-12-03: 500 mg via INTRAVENOUS
  Filled 2016-12-03: qty 500

## 2016-12-03 MED ORDER — DEXAMETHASONE SODIUM PHOSPHATE 10 MG/ML IJ SOLN
INTRAMUSCULAR | Status: AC
  Filled 2016-12-03: qty 1

## 2016-12-03 MED ORDER — EPHEDRINE 5 MG/ML INJ: 10.0000 mg | INTRAVENOUS | Status: DC | PRN

## 2016-12-03 MED ORDER — OXYCODONE HCL 5 MG PO TABS: 5.0000 mg | ORAL_TABLET | ORAL | Status: DC | PRN

## 2016-12-03 MED ORDER — FENTANYL CITRATE (PF) 100 MCG/2ML IJ SOLN
100.0000 ug | INTRAMUSCULAR | Status: DC | PRN
  Administered 2016-12-03 (×2): 100 ug via INTRAVENOUS
  Filled 2016-12-03 (×2): qty 2

## 2016-12-03 MED ORDER — COCONUT OIL OIL: 1.0000 "application " | TOPICAL_OIL | Status: DC | PRN

## 2016-12-03 MED ORDER — ONDANSETRON HCL 4 MG/2ML IJ SOLN
INTRAMUSCULAR | Status: DC | PRN
  Administered 2016-12-03: 4 mg via INTRAVENOUS

## 2016-12-03 MED ORDER — DIPHENHYDRAMINE HCL 25 MG PO CAPS: 25.0000 mg | ORAL_CAPSULE | Freq: Four times a day (QID) | ORAL | Status: DC | PRN

## 2016-12-03 MED ORDER — LACTATED RINGERS IV SOLN: 500.0000 mL | INTRAVENOUS | Status: DC | PRN

## 2016-12-03 MED ORDER — WITCH HAZEL-GLYCERIN EX PADS: 1.0000 "application " | MEDICATED_PAD | CUTANEOUS | Status: DC | PRN

## 2016-12-03 MED ORDER — IBUPROFEN 600 MG PO TABS
600.0000 mg | ORAL_TABLET | Freq: Four times a day (QID) | ORAL | Status: DC
  Administered 2016-12-04 – 2016-12-05 (×6): 600 mg via ORAL
  Filled 2016-12-03 (×6): qty 1

## 2016-12-03 MED ORDER — MENTHOL 3 MG MT LOZG: 1.0000 | LOZENGE | OROMUCOSAL | Status: DC | PRN

## 2016-12-03 MED ORDER — OXYCODONE HCL 5 MG PO TABS: 5.0000 mg | ORAL_TABLET | Freq: Once | ORAL | Status: DC | PRN

## 2016-12-03 MED ORDER — SENNOSIDES-DOCUSATE SODIUM 8.6-50 MG PO TABS: 2.0000 | ORAL_TABLET | Freq: Every evening | ORAL | Status: DC | PRN

## 2016-12-03 MED ORDER — OXYTOCIN 40 UNITS IN LACTATED RINGERS INFUSION - SIMPLE MED
1.0000 m[IU]/min | INTRAVENOUS | Status: DC
  Administered 2016-12-03: 2 m[IU]/min via INTRAVENOUS

## 2016-12-03 MED ORDER — PENICILLIN G POTASSIUM 5000000 UNITS IJ SOLR
5.0000 10*6.[IU] | Freq: Once | INTRAVENOUS | Status: AC
  Administered 2016-12-03: 5 10*6.[IU] via INTRAVENOUS
  Filled 2016-12-03: qty 5

## 2016-12-03 MED ORDER — OXYTOCIN 10 UNIT/ML IJ SOLN
INTRAVENOUS | Status: DC | PRN
  Administered 2016-12-03: 40 [IU] via INTRAVENOUS

## 2016-12-03 SURGICAL SUPPLY — 35 items
BENZOIN TINCTURE PRP APPL 2/3 (GAUZE/BANDAGES/DRESSINGS) ×3 IMPLANT
CANISTER SUCT 3000ML PPV (MISCELLANEOUS) ×3 IMPLANT
CHLORAPREP W/TINT 26ML (MISCELLANEOUS) ×3 IMPLANT
CLOSURE WOUND 1/2 X4 (GAUZE/BANDAGES/DRESSINGS) ×1
DERMABOND ADVANCED (GAUZE/BANDAGES/DRESSINGS) ×2
DERMABOND ADVANCED .7 DNX12 (GAUZE/BANDAGES/DRESSINGS) ×1 IMPLANT
DRSG OPSITE POSTOP 4X10 (GAUZE/BANDAGES/DRESSINGS) ×3 IMPLANT
ELECT REM PT RETURN 9FT ADLT (ELECTROSURGICAL) ×3
ELECTRODE REM PT RTRN 9FT ADLT (ELECTROSURGICAL) ×1 IMPLANT
EXTRACTOR VACUUM KIWI (MISCELLANEOUS) ×3 IMPLANT
GLOVE BIOGEL PI IND STRL 7.0 (GLOVE) ×2 IMPLANT
GLOVE BIOGEL PI IND STRL 7.5 (GLOVE) ×1 IMPLANT
GLOVE BIOGEL PI INDICATOR 7.0 (GLOVE) ×4
GLOVE BIOGEL PI INDICATOR 7.5 (GLOVE) ×2
GLOVE SKINSENSE NS SZ7.0 (GLOVE) ×2
GLOVE SKINSENSE STRL SZ7.0 (GLOVE) ×1 IMPLANT
GOWN STRL REUS W/ TWL LRG LVL3 (GOWN DISPOSABLE) ×2 IMPLANT
GOWN STRL REUS W/ TWL XL LVL3 (GOWN DISPOSABLE) ×1 IMPLANT
GOWN STRL REUS W/TWL LRG LVL3 (GOWN DISPOSABLE) ×4
GOWN STRL REUS W/TWL XL LVL3 (GOWN DISPOSABLE) ×2
NS IRRIG 1000ML POUR BTL (IV SOLUTION) ×3 IMPLANT
PACK C SECTION WH (CUSTOM PROCEDURE TRAY) ×3 IMPLANT
PAD ABD 7.5X8 STRL (GAUZE/BANDAGES/DRESSINGS) ×3 IMPLANT
PAD OB MATERNITY 4.3X12.25 (PERSONAL CARE ITEMS) ×3 IMPLANT
PAD PREP 24X48 CUFFED NSTRL (MISCELLANEOUS) ×3 IMPLANT
PENCIL SMOKE EVAC W/HOLSTER (ELECTROSURGICAL) ×3 IMPLANT
STRIP CLOSURE SKIN 1/2X4 (GAUZE/BANDAGES/DRESSINGS) ×2 IMPLANT
SUT MNCRL 0 VIOLET CTX 36 (SUTURE) ×2 IMPLANT
SUT MON AB 4-0 PS1 27 (SUTURE) ×3 IMPLANT
SUT MONOCRYL 0 CTX 36 (SUTURE) ×4
SUT PLAIN 2 0 XLH (SUTURE) ×3 IMPLANT
SUT VIC AB 0 CT1 36 (SUTURE) ×9 IMPLANT
SUT VIC AB 3-0 CT1 27 (SUTURE) ×2
SUT VIC AB 3-0 CT1 TAPERPNT 27 (SUTURE) ×1 IMPLANT
TOWEL OR 17X24 6PK STRL BLUE (TOWEL DISPOSABLE) ×6 IMPLANT

## 2016-12-03 NOTE — Anesthesia Pain Management Evaluation Note (Signed)
  [facility] Pain Management Visit Note  Patient: Monique Hutchinson, 41 y.o., female  "Hello [facility] am [facility]. We have an anesthesia team available at all times to provide care throughout the hospital, including epidural management and anesthesia for C-section. I don't know your plan for the delivery whether it a natural birth, water birth, IV sedation, nitrous supplementation, doula or epidural, but we want to meet your pain goals."   1.Was your pain managed to your expectations on prior hospitalizations?   Yes   2.What is your expectation for pain management during this hospitalization?     Labor support without medications  3.How can we help you reach that goal? NURSING INTERVENTIONS  Record the patient's initial score and the patient's pain goal.   Pain: 3/10  Pain Goal: 3/10 [facility] wants you to be able to say your pain was always managed very well.  Salome ArntSterling, Gracia Saggese Marie 12/03/2016

## 2016-12-03 NOTE — Op Note (Signed)
Operative Note   SURGERY DATE: 12/03/2016  PRE-OP DIAGNOSIS:  *Pregnancy at 39/0 *History of cesarean x 1 *Failed VBAC due to fetal intolerance of labor *Abruption *GDMA2  POST-OP DIAGNOSIS: Same.   PROCEDURE: repeat low transverse cesarean section via joel cohen method with double layer uterine closure  SURGEON: Surgeon(s) and Role:    Vergie Living* Coltyn Hanning, Billey Goslingharlie, MD - Primary  ASSISTANT:    Rachelle Hora* Moss, Amber, DO - Fellow  ANESTHESIA: epidural  ESTIMATED BLOOD LOSS: 550mL  DRAINS: 100mL UOP via indwelling foley  TOTAL IV FLUIDS: 2700mL crystalloid  VTE PROPHYLAXIS: SCDs to bilateral lower extremities  ANTIBIOTICS: Two grams of Cefazolin were given., within 1 hour of skin incision and azithromycin 500mg  IV x 1 to be given in PACU  SPECIMENS: placenta and cord gases  COMPLICATIONS: none  FINDINGS: No intra-abdominal adhesions were noted. Grossly normal uterus, tubes and ovaries. bloody amniotic fluid, cephalic female infant, weight 3150gm, APGARs 6/8, intact placenta.  Results for Alfonzo BeersGARCIA-PARADA, BOY Kynleigh (MRN [id number]) as of 12/03/2016 20:26  Ref. Range 12/03/2016 19:46 12/03/2016 19:47  pH cord blood (arterial) Latest Ref Range: 7.210 - 7.380  6.984 (LL)   pCO2 cord blood (arterial) Latest Ref Range: 42.0 - 56.0 mmHg 79.0 (H)   Bicarbonate Latest Ref Range: 13.0 - 22.0 mmol/L 17.9 18.3  Ph Cord Blood (Venous) Latest Ref Range: 7.240 - 7.380   7.028 (LL)  pCO2 Cord Blood (Venous) Latest Ref Range: 42.0 - 56.0   73.2 (H)   PROCEDURE IN DETAIL: The patient was taken to the operating room where anesthesia was administered and normal fetal heart tones were confirmed. She was then prepped and draped in the normal fashion in the dorsal supine position with a leftward tilt.  After a time out was performed, aJoel Cohen incision was made with the scalpel and carried through to the underlying layer of fascia. The fascia was opened bluntly and the abdomen was entered and no adhesions felt. The  bladder blade was inserted and a low transverse hysterotomy was made with the scalpel until the endometrial cavity was breached and the amniotic sac was entered with scant, bloody amniotic fluid seen. This incision was extended bluntly and the infant's head, shoulders and body were delivered atraumatically.The cord was clamped x 2 and cut, and the infant was handed to the awaiting pediatricians, after delayed cord clamping was not done due concern for abruption and FHR on cord palpation was in the 80s.  The placenta was then gradually expressed from the uterus and then the uterus was exteriorized and cleared of all clots and debris. The hysterotomy was repaired with a running suture of 1-0 monocryl. A second imbricating layer of 1-0 monocryl suture was then placed.   The uterus and adnexa were then returned to the abdomen, and the hysterotomy and all operative sites were reinspected and excellent hemostasis was noted after irrigation and suction of the abdomen with warm saline.  The peritoneum was closed with a running stitch of 3-0 Vicryl. The fascia was reapproximated with 0 Vicryl in a simple running fashion bilaterally. The subcutaneous layer was then reapproximated with interrupted sutures of 2-0 plain gut, and the skin was then closed with 4-0 monocryl, in a subcuticular fashion.  The patient  tolerated the procedure well. Sponge, lap, needle, and instrument counts were correct x 2. The patient was transferred to the recovery room awake, alert and breathing independently in stable condition.  Cornelia Copaharlie Audi Wettstein, Jr. MD Attending Center for [facility](Faculty Practice)

## 2016-12-03 NOTE — Anesthesia Preprocedure Evaluation (Addendum)
Anesthesia Evaluation  Patient identified by MRN, date of birth, ID band Patient awake    Reviewed: Allergy & Precautions, NPO status , Patient's Chart, lab work & pertinent test results  Airway Mallampati: II  TM Distance: >3 FB Neck ROM: Full    Dental no notable dental hx. (+) Teeth Intact, Dental Advisory Given   Pulmonary neg pulmonary ROS,    Pulmonary exam normal breath sounds clear to auscultation       Cardiovascular negative cardio ROS Normal cardiovascular exam Rhythm:Regular Rate:Normal     Neuro/Psych negative neurological ROS  negative psych ROS   GI/Hepatic Neg liver ROS, GERD  Medicated,  Endo/Other  diabetes, Well Controlled, GestationalObesity   Renal/GU negative Renal ROS  negative genitourinary   Musculoskeletal negative musculoskeletal ROS (+)   Abdominal   Peds negative pediatric ROS (+)  Hematology negative hematology ROS (+) Plt 227k   Anesthesia Other Findings   Reproductive/Obstetrics (+) Pregnancy                            Anesthesia Physical Anesthesia Plan  ASA: II  Anesthesia Plan: Epidural   Post-op Pain Management:    Induction:   PONV Risk Score and Plan: Treatment may vary due to age or medical condition  Airway Management Planned:   Additional Equipment:   Intra-op Plan:   Post-operative Plan:   Informed Consent: I have reviewed the patients History and Physical, chart, labs and discussed the procedure including the risks, benefits and alternatives for the proposed anesthesia with the patient or authorized representative who has indicated his/her understanding and acceptance.   Dental advisory given  Plan Discussed with:   Anesthesia Plan Comments: (Patient identified. Risks/Benefits/Options discussed with patient including but not limited to bleeding, infection, nerve damage, paralysis, failed block, incomplete pain control, headache,  blood pressure changes, nausea, vomiting, reactions to medication both or allergic, itching and postpartum back pain. Confirmed with bedside nurse the patient's most recent platelet count. Confirmed with patient that they are not currently taking any anticoagulation, have any bleeding history or any family history of bleeding disorders. Patient expressed understanding and wished to proceed. All questions were answered. )        Anesthesia Quick Evaluation

## 2016-12-03 NOTE — Transfer of Care (Signed)
Immediate Anesthesia Transfer of Care Note  Patient: Monique Hutchinson  Procedure(s) Performed: CESAREAN SECTION (N/A )  Patient Location: PACU  Anesthesia Type:Regional and Epidural  Level of Consciousness: awake, alert , oriented and patient cooperative  Airway & Oxygen Therapy: Patient Spontanous Breathing  Post-op Assessment: Report given to RN, Post -op Vital signs reviewed and stable and Patient moving all extremities X 4  Post vital signs: Reviewed and stable  Last Vitals:  Vitals:   12/03/16 1856 12/03/16 1857  BP:    Pulse:    Resp: 20 18  Temp:    SpO2:      Last Pain:  Vitals:   12/03/16 1844  TempSrc:   PainSc: 0-No pain         Complications: No apparent anesthesia complications

## 2016-12-03 NOTE — Anesthesia Procedure Notes (Signed)
Epidural Patient location during procedure: OB Start time: 12/03/2016 5:56 PM End time: 12/03/2016 6:01 PM  Staffing Anesthesiologist: Cecile HearingURK, Jonahtan Manseau EDWARD Performed: anesthesiologist   Preanesthetic Checklist Completed: patient identified, pre-op evaluation, timeout performed, IV checked, risks and benefits discussed and monitors and equipment checked  Epidural Patient position: sitting Prep: DuraPrep Patient monitoring: blood pressure and continuous pulse ox Approach: midline Location: L3-L4 Injection technique: LOR air  Needle:  Needle type: Tuohy  Needle gauge: 17 G Needle length: 9 cm Needle insertion depth: 5 cm Catheter size: 19 Gauge Catheter at skin depth: 9 cm Test dose: negative and Other (1% Lidocaine)  Additional Notes Patient identified.  Risk benefits discussed including failed block, incomplete pain control, headache, nerve damage, paralysis, blood pressure changes, nausea, vomiting, reactions to medication both toxic or allergic, and postpartum back pain.  Patient expressed understanding and wished to proceed.  All questions were answered.  Sterile technique used throughout procedure and epidural site dressed with sterile barrier dressing. No paresthesia or other complications noted. The patient did not experience any signs of intravascular injection such as tinnitus or metallic taste in mouth nor signs of intrathecal spread such as rapid motor block. Please see nursing notes for vital signs. Reason for block:procedure for pain

## 2016-12-03 NOTE — Progress Notes (Addendum)
Labor Progress Note Monique Hutchinson is a 41 y.o. [id number] at 7770w0d presented for IOL for A2GDM. This is a TOLAC S: Patient is feeling more uncomfortable.   O:  BP 128/70   Pulse 83   Temp 98 F (36.7 C) (Oral)   Resp 18   Ht 4\' 8"  (1.422 m)   Wt 148 lb (67.1 kg)   LMP 03/05/2016   BMI 33.18 kg/m  EFM: 120/mod var/pos acels/late decel  CVE: Dilation: 7 Effacement (%): 90 Cervical Position: Posterior Station: -2 Presentation: Vertex Exam by:: Dr. Rachelle HoraMoss   A&P: 41 y.o. [id number] 3670w0d here for IOL for A2GDM. #Labor: Not much cervical change. AROM at 1725, clear fluid. Continue pitocin. #Pain:  Upon maternal request #FWB: cat 2  Rolm BookbinderAmber Lakima Dona, DO 5:31 PM

## 2016-12-03 NOTE — H&P (Addendum)
Monique Hutchinson is an 41 y.o. [id number] 7658w0d female.   Chief Complaint: 39 wk labor HPI: Prior C-section x 1, VBAC x 1 3 lb baby, with GDM A2 for IOL.  Past Medical History:  Diagnosis Date  . GERD (gastroesophageal reflux disease)    When pregnant only  . Gestational diabetes   . 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 CESAREAN SECTION  2010  . CHOLECYSTECTOMY  2010   Laparoscopic  . LEG SURGERY Right Age 805-39 years old   Due to osteomyelitis of right tibia    History reviewed. No pertinent family history. Social History:  reports that she has never smoked. She has never used smokeless tobacco. She reports that she does not drink alcohol or use drugs.   No Known Allergies  Medications Prior to Admission  Medication Sig Dispense Refill  . acetaminophen (TYLENOL) 325 MG tablet Take 650 mg by mouth every 6 (six) hours as needed.    . glyBURIDE (DIABETA) 2.5 MG tablet Take 0.5 tablets (1.25 mg total) by mouth daily with breakfast. 30 tablet 1  . loratadine (CLARITIN) 10 MG tablet Take 1 tablet (10 mg total) by mouth daily. (Patient not taking: Reported on 10/27/2016) 30 tablet 11  . prenatal vitamin w/FE, FA (PRENATAL 1 + 1) 27-1 MG TABS tablet 1 tab by mouth daily 30 each 11     A comprehensive review of systems was negative.  Blood pressure 128/87, pulse 87, temperature 98 F (36.7 C), temperature source Oral, resp. rate 18, height 4\' 8"  (1.422 m), weight 148 lb (67.1 kg), last menstrual period 03/05/2016. BP 128/87   Pulse 87   Temp 98 F (36.7 C) (Oral)   Resp 18   Ht 4\' 8"  (1.422 m)   Wt 148 lb (67.1 kg)   LMP 03/05/2016   BMI 33.18 kg/m  General appearance: alert, cooperative and appears stated age Head: Normocephalic, without obvious abnormality,  atraumatic Neck: supple, symmetrical, trachea midline Lungs: normal effort Heart: regular rate and rhythm Abdomen: gravid, NT Extremities: Homans sign is negative, no sign of DVT old scar on right leg noted Skin: Skin color, texture, turgor normal. No rashes or lesions Neurologic: Grossly normal Dilation: 1 (foley bulb insertion attempted) Effacement (%): 50 Cervical Position: Posterior Station: -2 Presentation: Vertex Exam by:: Dr. Shawnie PonsPratt   Lab Results  Component Value Date   WBC 10.3 12/03/2016   HGB 14.1 12/03/2016   HCT 40.1 12/03/2016   MCV 88.9 12/03/2016   PLT 227 12/03/2016         ABO, Rh: --/--/O POS (11/01 [id number])  Antibody: PENDING (11/01 0742)  Rubella: Immune (05/21 0000)  RPR: Non Reactive (08/07 0914)  HBsAg: Negative (05/21 0000)  HIV: Non-reactive (05/21 0000)  GBS: Positive (10/09 0000)     Assessment/Plan Active Problems:   Group B Streptococcus carrier, +RV culture, currently pregnant   Gestational diabetes mellitus (GDM) in third trimester  For IOL U/s shows 74% GBS positive - PCN VBAC-no cytotec--unable to place foley balloon--begin low dose pitocin.  Reva Boresanya S Pratt 12/03/2016, 9:07 AM

## 2016-12-03 NOTE — Progress Notes (Signed)
Labor Progress Note Monique Hutchinson is a 41 y.o. [id number] at 7962w0d presented for IOL for A2GDM. This is TOLAC. S: Feeling more uncomfortable  O:  BP (!) 116/59   Pulse 66   Temp 98.1 F (36.7 C) (Oral)   Resp 18   Ht 4\' 8"  (1.422 m)   Wt 148 lb (67.1 kg)   LMP 03/05/2016   BMI 33.18 kg/m  EFM: 120 bpm/mod var/pos acels/ no decels  CVE: Dilation: 6 Effacement (%): 60 Cervical Position: Posterior Station: Ballotable Presentation: Vertex Exam by:: J.Thornton< RN    A&P: 41 y.o. A5W0981G3P1102 8762w0d here for IOL for A2GDM #Labor: Progressing well. Did not attempt AROM because baby's head is ballotable #Pain: upon maternal request #FWB: cat 1 #GBS positive  #A2GDM: glucose this am, 75   Nailani Full, DO 2:37 PM

## 2016-12-03 NOTE — Consult Note (Signed)
Neonatology Note:   Attendance at C-section:    I was asked by Dr. Vergie LivingPickens to attend this C/S at term after failed IOL due to decels.. The mother is a [id number], GBS positive with aIAP with good prenatal care complicated by AMA,  pre-eclampsia, and GDMA2. S/p ROM 2 hours before delivery, fluid clear. Infant fairly vigorous with fair spontaneous cry and tone.  Cord clamped early and infant brought to warmer where he was dried and suctioned.  Tone, color and RR improved however rsepirations were accompanied by persistent grunting.  SAo2 placed and appropriate in 90s with HR ~150.  Coarse lungs LT > Rt.  Chest PT conducted for a brief period as well as CPAP, without improvements in grunting and WOB.  Monitoed infant in OR for ~4620min.  Parents updated via interpretor.  Ap 6/8. FOB accompanied us to NICU where baby was surprisingly breathing more comfortable; still with intermittent grunting.  Will continue transitional monitoring in NICU to determine if infant needs NICU admission.      Dineen Kidavid C. Leary RocaEhrmann, MD

## 2016-12-04 ENCOUNTER — Encounter: Payer: Self-pay | Admitting: Obstetrics and Gynecology

## 2016-12-04 ENCOUNTER — Encounter (HOSPITAL_COMMUNITY): Payer: Self-pay | Admitting: Obstetrics and Gynecology

## 2016-12-04 DIAGNOSIS — O4590 Premature separation of placenta, unspecified, unspecified trimester: Secondary | ICD-10-CM | POA: Insufficient documentation

## 2016-12-04 LAB — CBC
HEMATOCRIT: 32 % — AB (ref 36.0–46.0)
Hemoglobin: 11.5 g/dL — ABNORMAL LOW (ref 12.0–15.0)
MCH: 31.9 pg (ref 26.0–34.0)
MCHC: 35.9 g/dL (ref 30.0–36.0)
MCV: 88.9 fL (ref 78.0–100.0)
PLATELETS: 171 10*3/uL (ref 150–400)
RBC: 3.6 MIL/uL — AB (ref 3.87–5.11)
RDW: 13.3 % (ref 11.5–15.5)
WBC: 16.5 10*3/uL — AB (ref 4.0–10.5)

## 2016-12-04 LAB — GLUCOSE, CAPILLARY
GLUCOSE-CAPILLARY: 94 mg/dL (ref 65–99)
Glucose-Capillary: 83 mg/dL (ref 65–99)

## 2016-12-04 NOTE — Progress Notes (Signed)
POSTPARTUM PROGRESS NOTE  Post Partum Day 1/ Post-Op Day 1  Subjective:  Monique Hutchinson is a 41 y.o. [id number] s/p rLTCS due to failed IOL due to decels at 3074w1d.  No acute events overnight.  Pt has not ambulated yet but plans to ambulate today. Denies problems with po intake.  She denies nausea or vomiting.  Pain is well controlled.  She has not had flatus. She has not had bowel movement.  Lochia Small.   Objective: Blood pressure 115/64, pulse 90, temperature 98.7 F (37.1 C), temperature source Oral, resp. rate 18, height 4\' 8"  (1.422 m), weight 148 lb (67.1 kg), last menstrual period 03/05/2016, SpO2 98 %.  Physical Exam:  General: alert, cooperative and no distress Chest: no respiratory distress Heart:regular rate, distal pulses intact Abdomen: soft, nontender,  Uterine Fundus: firm, appropriately tender DVT Evaluation: No calf swelling or tenderness Extremities: some edema with upper extremities Skin: warm, dry,  incision clean/dry/intact with honeycomb   Recent Labs  12/03/16 0742 12/04/16 0537  HGB 14.1 11.5*  HCT 40.1 32.0*    Assessment/Plan: Monique Hutchinson is a 41 y.o. [id number] s/p  rLTCS due to failed IOL due to decels at 9574w1d  PPD#1 - Doing well Contraception: undecided Feeding: breast Dispo: Plan for discharge in 1 or 2 days.   LOS: 1 day   Lynnae PrudeKeriann S MinottMD 12/04/2016, 11:31 AM

## 2016-12-04 NOTE — Progress Notes (Signed)
OB Note I went by and talked to her and her husband about need for c-section and intraoperative findings of an abruption. Pt and baby are doing fine and baby is in the room. All questions asked and answered including post op and incision instructions.  Interpreter used  Asher Bingharlie Antonius Hartlage, Montez HagemanJr MD Attending Center for [facility](Faculty Practice)

## 2016-12-04 NOTE — Lactation Note (Signed)
This note was copied from a baby's chart. Lactation Consultation Note Spanish speaking mom, used interpreter [phone]#[id number] Rexford MausWalkiria.   Experiecned BF mom BF her 268 yr old and 624 yr old for 6 months. Mom has round short shaft nipples. Encouraged to roll in finger tips to evert more. Hand expression w/colostrum shown to mom, encouraged to pat on nipples. Mom denied painful latching.  Discussed newborn behavior, STS, I&O, cluster feeding, supply and demand. Gave mom baby d/t fussing slightly. Mom attempted to latch onto breast, baby wasn't interested. Discussed positioning, support, and body alignment.  Mom has WIC, Mom encouraged to feed baby 8-12 times/24 hours and with feeding cues.  [facility]/LC brochure given w/resources, support groups and LC services.  Patient Name: Boy Sanjuana Maelba Garcia-Parada EAVWU'JToday's Date: 12/04/2016 Reason for consult: Initial assessment   Maternal Data Has patient been taught Hand Expression?: Yes Does the patient have breastfeeding experience prior to this delivery?: Yes  Feeding Feeding Type: Formula Nipple Type: Slow - flow  LATCH Score Latch: Too sleepy or reluctant, no latch achieved, no sucking elicited.  Audible Swallowing: None  Type of Nipple: Everted at rest and after stimulation  Comfort (Breast/Nipple): Soft / non-tender  Hold (Positioning): Assistance needed to correctly position infant at breast and maintain latch.  LATCH Score: 5  Interventions Interventions: Breast feeding basics reviewed;Breast compression;Assisted with latch;Adjust position;Skin to skin;Support pillows;Breast massage;Position options;Hand express  Lactation Tools Discussed/Used WIC Program: Yes   Consult Status Consult Status: Follow-up Date: 12/05/16 Follow-up type: In-patient    Argie Applegate, Diamond NickelLAURA G 12/04/2016, 7:02 AM

## 2016-12-04 NOTE — Progress Notes (Signed)
Upon assessing mom's ID and baby's ID bracelet did not match. Baby: [id number] Mom and Dad: [id number]. Delivery summary bands were [id number]. Baby was a NICU baby. Band sheet still had mom and dad's bracelets attached. Delivery summary number and Medical record numbers were matched. New matching bands administered for mom and dad; [id number]. Nursery nurse was notified.  Teiara Baria L Keefe Zawistowski, RN  

## 2016-12-04 NOTE — Addendum Note (Signed)
Addendum  created 12/04/16 1000 by Algis GreenhouseBurger, Toi Stelly A, CRNA   Anesthesia Event edited

## 2016-12-04 NOTE — Anesthesia Postprocedure Evaluation (Signed)
Anesthesia Post Note  Patient: Monique Hutchinson  Procedure(s) Performed: CESAREAN SECTION (N/A )     Patient location during evaluation: PACU Anesthesia Type: Epidural Level of consciousness: oriented and awake and alert Pain management: pain level controlled Vital Signs Assessment: post-procedure vital signs reviewed and stable Respiratory status: spontaneous breathing, respiratory function stable and patient connected to nasal cannula oxygen Cardiovascular status: blood pressure returned to baseline and stable Postop Assessment: no headache, no backache, no apparent nausea or vomiting and epidural receding Anesthetic complications: no    Last Vitals:  Vitals:   12/04/16 0010 12/04/16 0115  BP: 125/65 126/71  Pulse: 92 84  Resp: 18 18  Temp:  36.9 C  SpO2: 95% 95%    Last Pain:  Vitals:   12/04/16 0115  TempSrc: Oral  PainSc: 0-No pain   Pain Goal:                 Catheryn Baconyan P Sumayya Muha

## 2016-12-04 NOTE — Addendum Note (Signed)
Addendum  created 12/04/16 0801 by Angela AdamWrinkle, Nadene Witherspoon G, CRNA   Sign clinical note

## 2016-12-04 NOTE — Anesthesia Postprocedure Evaluation (Signed)
Anesthesia Post Note  Patient: Monique Hutchinson  Procedure(s) Performed: CESAREAN SECTION (N/A )     Patient location during evaluation: Mother Baby Anesthesia Type: Epidural Level of consciousness: awake and alert, oriented and patient cooperative Pain management: pain level controlled Vital Signs Assessment: post-procedure vital signs reviewed and stable Respiratory status: spontaneous breathing Cardiovascular status: stable Postop Assessment: no headache, epidural receding, patient able to bend at knees and no signs of nausea or vomiting Anesthetic complications: no Comments: Pain score 3.    Last Vitals:  Vitals:   12/04/16 0115 12/04/16 0530  BP: 126/71 112/64  Pulse: 84 87  Resp: 18 18  Temp: 36.9 C 36.8 C  SpO2: 95% 96%    Last Pain:  Vitals:   12/04/16 0530  TempSrc: Oral  PainSc: 2    Pain Goal:                 [facility] DaytonWRINKLE,Kyrstal Monterrosa

## 2016-12-05 ENCOUNTER — Encounter (HOSPITAL_COMMUNITY): Payer: Self-pay | Admitting: *Deleted

## 2016-12-05 MED ORDER — IBUPROFEN 600 MG PO TABS: 600.0000 mg | ORAL_TABLET | Freq: Four times a day (QID) | ORAL | 0 refills | Status: DC | PRN

## 2016-12-05 MED ORDER — OXYCODONE HCL 5 MG PO TABS: 5.0000 mg | ORAL_TABLET | ORAL | 0 refills | Status: DC | PRN

## 2016-12-05 NOTE — Discharge Instructions (Signed)
Parto por cesárea, cuidados posteriores °(Cesarean Delivery, Care After) °Siga estas instrucciones durante las próximas semanas. Estas indicaciones le proporcionan información acerca de cómo deberá cuidarse después del procedimiento. El médico también podrá darle instrucciones más específicas. El tratamiento ha sido planificado según las prácticas médicas actuales, pero en algunos casos pueden ocurrir problemas. Comuníquese con el médico si tiene algún problema o dudas después del procedimiento. °QUÉ ESPERAR DESPUÉS DEL PROCEDIMIENTO °Después del procedimiento, es común tener los siguientes síntomas: °· Una pequeña cantidad de sangre o un líquido transparente que sale de la incisión. °· Tiene enrojecimiento, hinchazón o dolor en la zona de la incisión. °· Dolores y molestias abdominales. °· Hemorragia vaginal (loquios). °· Calambres pélvicos. °· Fatiga. °INSTRUCCIONES PARA EL CUIDADO EN EL HOGAR °Cuidado de la incisión °· Siga las indicaciones del médico acerca del cuidado de la incisión. Haga lo siguiente: °? Lávese las manos con agua y jabón antes de cambiar las vendas (vendaje). Use desinfectante para manos si no dispone de agua y jabón. °? Cambie el vendaje como se lo haya indicado el médico. °? No retire los puntos (suturas), las grapas cutáneas, el pegamento para la piel o las tiras adhesivas. Es posible que estos deban quedar puestos en la piel durante 2 semanas o más tiempo. Si los bordes de las tiras adhesivas empiezan a despegarse y enroscarse, puede recortar los que están sueltos. No retire las tiras adhesivas por completo a menos que el médico se lo indique. °· Controle todos los días la zona de la incisión para detectar signos de infección. Esté atenta a los siguientes signos: °? Aumento del enrojecimiento, la hinchazón o el dolor. °? Más líquido o sangre. °? Calor. °? Pus o mal olor. °· Cuando tosa o estornude, abrace una almohada. Esto ayuda con el dolor y disminuye la posibilidad de que su incisión  se abra (dehiscencia). Haga esto hasta que cicatrice completamente. °Medicamentos °· Tome los medicamentos de venta libre y los recetados solamente como se lo haya indicado el médico. °· Si le recetaron un antibiótico, tómelo como se lo haya indicado el médico. No interrumpa la administración del antibiótico hasta que no lo hay terminado. °Conducir °· No conduzca ni opere maquinaria pesada mientras toma analgésicos recetados. °· No conduzca durante 24 horas si le administraron un sedante. °Estilo de vida °· No beba alcohol. Esto es de suma importancia si está amamantando o toma analgésicos. °· No consuma productos que contengan tabaco, incluidos cigarrillos, tabaco de mascar o cigarrillos electrónicos. Si necesita ayuda para dejar de fumar, consulte al médico. El tabaco puede retrasar la cicatrización. °Comida y bebida °· Beba al menos 8 vasos de 8 onzas (240 cc) de agua todos los días a menos que el médico le indique lo contrario. Si amamanta, quizá deba beber aún más cantidad de agua. °· Ingiera alimentos ricos en fibras todos los días. Estos alimentos pueden ayudarla a prevenir o aliviar el estreñimiento. Los alimentos ricos en fibras incluyen, entre otros: °? Panes y cereales integrales. °? Arroz integral. °? Frijoles. °? Frutas y verduras frescas. °Actividad °· Reanude sus actividades normales como se lo haya indicado el médico. Pregúntele al médico qué actividades son seguras para usted. °· Descanse todo lo que pueda. Trate de descansar o tomar una siesta mientras el bebé duerme. °· No levante objetos que pesen más que su bebé o más de 10 libras (4,5 kg), como se lo haya indicado el médico. °· Pregúntele al médico cuándo puede volver a tener relaciones sexuales. Esto puede depender de lo siguiente: °? Riesgo de sufrir   infecciones. °? Velocidad de cicatrización. °? Comodidad y deseo de tener relaciones sexuales. °El baño °· No tome baños de inmersión, no nade ni use el jacuzzi hasta que el médico lo autorice.  Pregúntele al médico si puede ducharse. Tal vez solo le permitan darse baños de esponja hasta que la incisión se cure. °· Mantenga el vendaje seco, como se lo haya indicado el médico. °Instrucciones generales °· No use tampones ni se haga duchas vaginales hasta que el médico la autorice. °· Use lo siguiente: °? Ropa cómoda y suelta. °? Un sostén firme y bien ajustado. °· Controle la sangre que elimina por la vagina para detectar coágulos. Pueden tener el aspecto de grumos de color rojo oscuro, marrón o negro. °· Mantenga el perineo limpio y seco, como se lo haya indicado el médico. °· Cuando vaya al baño, siempre higienícese de adelante hacia atrás. °· Si es posible, consiga que alguien la ayude a cuidar de su bebé y con las tareas domésticas durante algunos días después de recibir el alta hospitalaria °· Concurra a todas las visitas de seguimiento para usted y el bebé, como se lo haya indicado el médico. Esto es importante. °SOLICITE ATENCIÓN MÉDICA SI: °· Tiene los siguientes síntomas: °? Una secreción vaginal con mal olor. °? Dificultad para orinar. °? Dolor al orinar. °? Aumento o disminución repentinos de la frecuencia con que defeca. °? Aumento del enrojecimiento, de la hinchazón o del dolor alrededor de la incisión. °? Aumento del líquido o de la sangre que sale de la incisión. °? Pus o mal olor en el lugar de la incisión. °? Fiebre. °? Una erupción cutánea. °? Poco interés o falta de interés en actividades que solían gustarle. °? Dudas sobre su cuidado y el del bebé. °? Náuseas. °· La incisión está caliente al tacto. °· Sus senos se ponen rojos o duros, o causan dolor. °· Siente tristeza o preocupación de forma inusual. °· Vomita. °· Elimina coágulos grandes por la vagina. Si elimina un coágulo de sangre, guárdelo para mostrárselo al médico. No elimine los coágulos sanguíneos por el inodoro sin mostrárselos a su médico. °· Orina más de lo habitual. °· Se siente mareada o se desmaya. °· No amamantó al bebé y  no tuvo su período menstrual en un período de 12 semanas posterior al parto. °· Usted dejó de amamantar al bebé y no tuvo su período menstrual en un período de 12 semanas posterior a haber dejado de amamantar. ° °SOLICITE ATENCIÓN MÉDICA DE INMEDIATO SI: °· Tiene los siguientes síntomas: °? Dolor que no desaparece o no mejora con el medicamento. °? Dolor en el pecho. °? Dificultad para respirar. °? Visión borrosa o manchas en la vista. °? Pensamientos de autolesionarse o lesionar al bebé. °? Nuevo dolor en el abdomen o en una de las piernas. °? Dolor de cabeza intenso. °· Se desmaya. °· Tiene una hemorragia tan intensa de la vagina que empapa dos toallitas sanitarias en una hora. ° °Esta información no tiene como fin reemplazar el consejo del médico. Asegúrese de hacerle al médico cualquier pregunta que tenga. °Document Released: 01/19/2005 Document Revised: 05/13/2015 Document Reviewed: 12/24/2014 °Elsevier Interactive Patient Education © 2017 [employer] ° °

## 2016-12-05 NOTE — Discharge Summary (Signed)
OB Discharge Summary     Patient Name: Monique Hutchinson DOB: Apr 11, 1975 MRN: [id number]  Date of admission: 12/03/2016 Delivering MD: [place] Bing   Date of discharge: 12/05/2016  Admitting diagnosis: INDUCTION Intrauterine pregnancy: [redacted]w[redacted]d     Secondary diagnosis:  Active Problems:   Group B Streptococcus carrier, +RV culture, currently pregnant   Gestational diabetes mellitus (GDM) in third trimester  Additional problems: prev C/S followed by VBAC x 1; AMA     Discharge diagnosis: Term Pregnancy Delivered and GDM A2                                                                                                Post partum procedures:none  Augmentation: AROM, Pitocin and Foley Balloon  Complications: None (failed VBAC)  Hospital course:  Induction of Labor With Cesarean Section  41 y.o. yo [id number] at [redacted]w[redacted]d was admitted to the hospital 12/03/2016 for induction of labor (TOLAC) due to [facility]. Patient had a labor course significant for becoming 7cm and then having FHR variables. The patient went for cesarean section due to Non-Reassuring FHR, and delivered a Viable infant, Membrane Rupture Time/Date: )5:25 PM ,12/03/2016   @Details  of operation can be found in separate operative Note.  Patient had an uncomplicated postpartum course. She is ambulating, tolerating a regular diet, passing flatus, and urinating well.  Patient is discharged home in stable condition on 12/05/16.                                    Physical exam  Vitals:   12/04/16 0951 12/04/16 1200 12/04/16 2000 12/05/16 0612  BP: 115/64 120/60 119/62 (!) 99/59  Pulse: 90 80 78 81  Resp: 18 20 18 16   Temp: 98.7 F (37.1 C) 98.3 F (36.8 C) 98.2 F (36.8 C) 98.1 F (36.7 C)  TempSrc: Oral Oral Oral Oral  SpO2: 98% 99%    Weight:      Height:       General: alert and cooperative Lochia: appropriate Uterine Fundus: firm Incision: honeycomb dry, intact DVT Evaluation: No evidence of DVT seen on physical  exam. Labs: Lab Results  Component Value Date   WBC 16.5 (H) 12/04/2016   HGB 11.5 (L) 12/04/2016   HCT 32.0 (L) 12/04/2016   MCV 88.9 12/04/2016   PLT 171 12/04/2016   CMP Latest Ref Rng & Units 12/03/2016  Glucose 65 - 99 mg/dL 75  BUN 6 - 20 mg/dL -  Creatinine 1.47 - 8.29 mg/dL -  Sodium 562 - 130 mmol/L -  Potassium 3.5 - 5.2 mmol/L -  Chloride 96 - 106 mmol/L -  CO2 20 - 29 mmol/L -  Calcium 8.7 - 10.2 mg/dL -  Total Protein 6.0 - 8.5 g/dL -  Total Bilirubin 0.0 - 1.2 mg/dL -  Alkaline Phos 39 - 865 IU/L -  AST 0 - 40 IU/L -  ALT 0 - 32 IU/L -    Discharge instruction: per After Visit Summary and "Baby and Me Booklet".  After visit meds:  Allergies as of 12/05/2016   No Known Allergies     Medication List    STOP taking these medications   glyBURIDE 2.5 MG tablet Commonly known as:  DIABETA   loratadine 10 MG tablet Commonly known as:  CLARITIN     TAKE these medications   ibuprofen 600 MG tablet Commonly known as:  ADVIL,MOTRIN Take 1 tablet (600 mg total) by mouth every 6 (six) hours as needed.   oxyCODONE 5 MG immediate release tablet Commonly known as:  Oxy IR/ROXICODONE Take 1 tablet (5 mg total) by mouth every 4 (four) hours as needed (pain scale 4-7).   prenatal vitamin w/FE, FA 27-1 MG Tabs tablet 1 tab by mouth daily       Diet: routine diet  Activity: Advance as tolerated. Pelvic rest for 6 weeks.   Outpatient follow up:6 weeks Follow up Appt:No future appointments. Follow up Visit:No Follow-up on file.  Postpartum contraception: Undecided  Newborn Data: Live born female  Birth Weight: 6 lb 15.1 oz (3150 g) APGAR: 6, 8  Newborn Delivery   Birth date/time:  12/03/2016 19:19:00 Delivery type:  C-Section, Low Transverse  C-section categorization:  Repeat     Baby Feeding: Breast Disposition:home with mother   12/05/2016 Cam HaiSHAW, Othar Curto, CNM 12/05/2016

## 2016-12-21 ENCOUNTER — Telehealth: Payer: Self-pay | Admitting: General Practice

## 2016-12-21 NOTE — Telephone Encounter (Signed)
Called and notified patient (with interpreter) of Postpartum appointment along with 2hr gtts on 01/13/17 at 8:20am.  Patient voiced understanding.

## 2017-01-11 ENCOUNTER — Ambulatory Visit: Payer: Self-pay | Admitting: Medical

## 2017-01-13 ENCOUNTER — Encounter: Payer: Self-pay | Admitting: Family Medicine

## 2017-01-13 ENCOUNTER — Ambulatory Visit (INDEPENDENT_AMBULATORY_CARE_PROVIDER_SITE_OTHER): Payer: Self-pay | Admitting: Family Medicine

## 2017-01-13 DIAGNOSIS — O24439 Gestational diabetes mellitus in the puerperium, unspecified control: Secondary | ICD-10-CM

## 2017-01-13 MED ORDER — NORGESTIMATE-ETH ESTRADIOL 0.25-35 MG-MCG PO TABS: 1.0000 | ORAL_TABLET | Freq: Every day | ORAL | 11 refills | Status: DC

## 2017-01-13 NOTE — Progress Notes (Signed)
Subjective:     Monique Hutchinson is a 41 y.o. female who presents for a postpartum visit. She is 6 weeks postpartum following a low transverse Cesarean section. I have fully reviewed the prenatal and intrapartum course. The delivery was at 39 gestational weeks. Outcome: VBAC attempted. Anesthesia: epidural. Postpartum course has been unremarkable. Baby's course has been uncomplicated. Baby is feeding by both breast and bottle . Bleeding no bleeding. Bowel function is normal. Bladder function is normal. Patient is not sexually active. Contraception method is none. Postpartum depression screening: negative.  The following portions of the patient's history were reviewed and updated as appropriate: allergies, current medications, past family history, past medical history, past social history, past surgical history and problem list.  Review of Systems Pertinent items are noted in HPI.   Objective:    BP 107/69   Pulse (!) 53   Ht 4\' 6"  (1.372 m)   Wt 127 lb 4.8 oz (57.7 kg)   BMI 30.69 kg/m   General:  alert, cooperative and no distress   HEENT:  [place]/AT, conjunctivae noninjected, no scleral icterus   Lungs: Normal respiratory effort  Heart:  Normal rate  Abdomen: Soft, NT, ND   Skin:  Warm, dry; C/S scar healing well without erythema or drainage  Psych: Normal mood and affect  Neuro:  Alert and oriented        Assessment:     1. Normal postpartum exam.    2.  LSIL pap with +HPV on 06/22/16 -- needs colposcopy  3. A2GDM  Plan:    1. [facility]: [facility] OCPs precriped to brigde  2. LSIL +HPV: Message sent to [facility]BCCCP program so we can schedule colposcopy ASAP  3. GDM" 2-hr GTT today   Raynelle FanningJulie P. Ishmel Acevedo, MD OB Fellow

## 2017-01-15 LAB — GLUCOSE TOLERANCE, 2 HOURS
GLUCOSE FASTING GTT: 85 mg/dL (ref 65–99)
Glucose, 2 hour: 116 mg/dL (ref 65–139)

## 2017-10-24 IMAGING — US US MFM OB TRANSVAGINAL
1 series · 15 of 28 positions shown · non-contrast
Comparison: none

[Series 1: us mfm ob transvaginal · 15 of 44 slices shown]
[im 1/44]
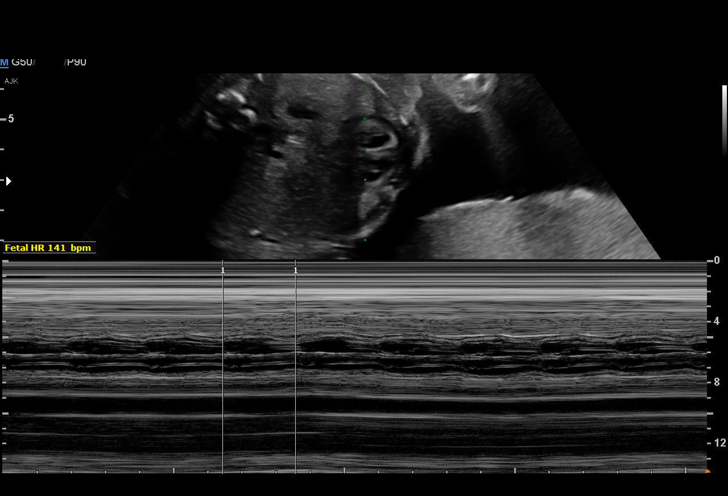
[im 4/44]
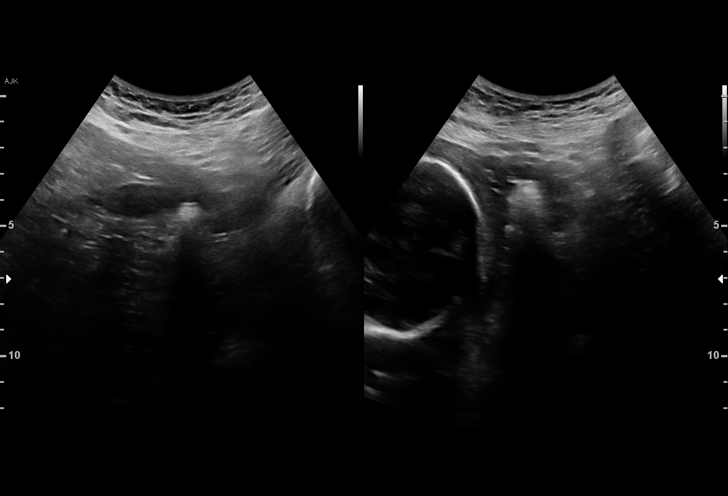
[im 7/44]
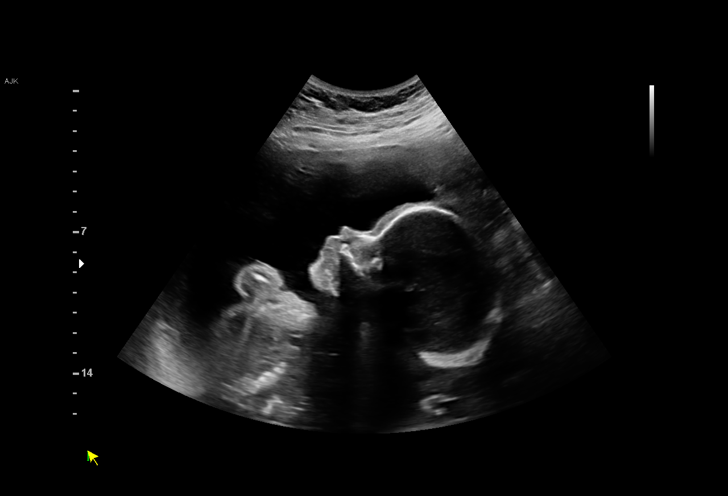
[im 10/44]
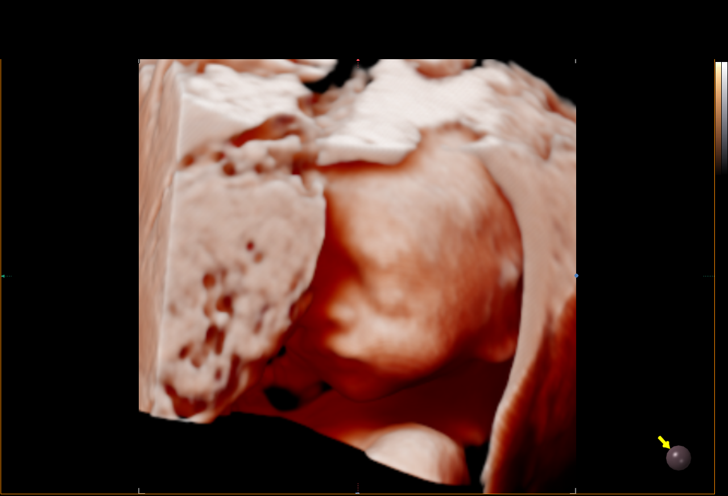
[im 13/44]
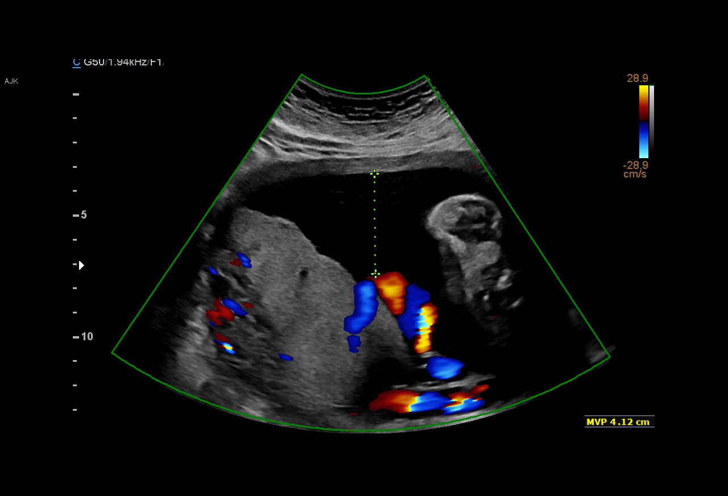
[im 16/44]
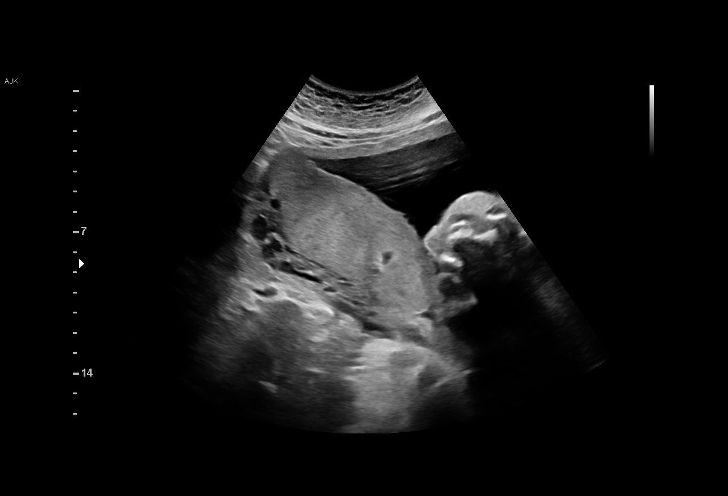
[im 20/44]
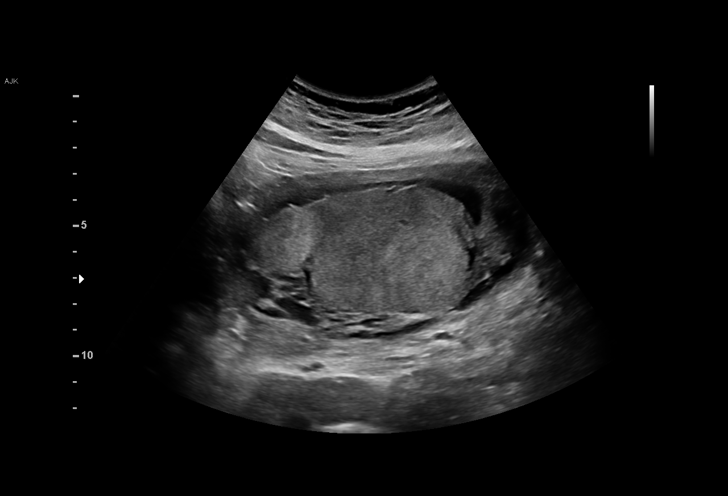
[im 23/44]
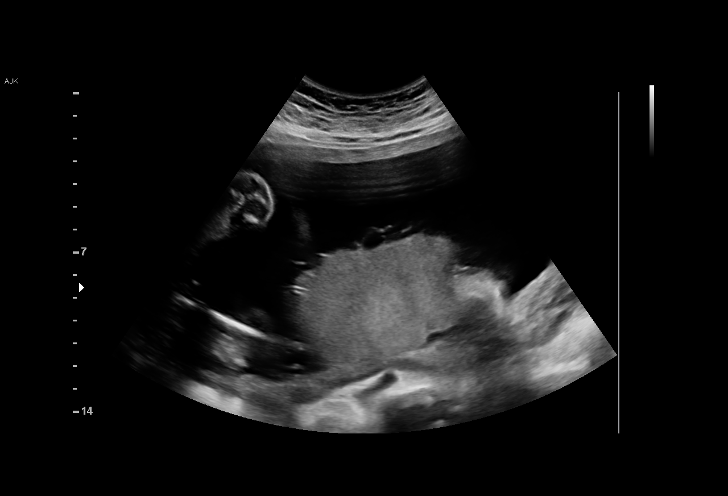
[im 24/44]
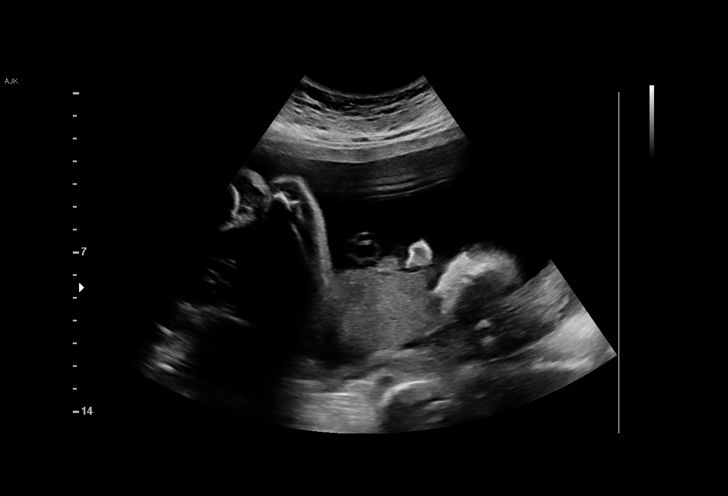
[im 28/44]
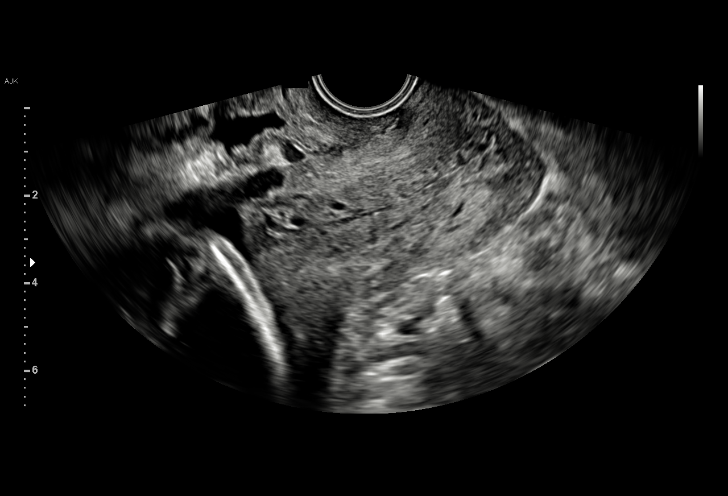
[im 31/44]
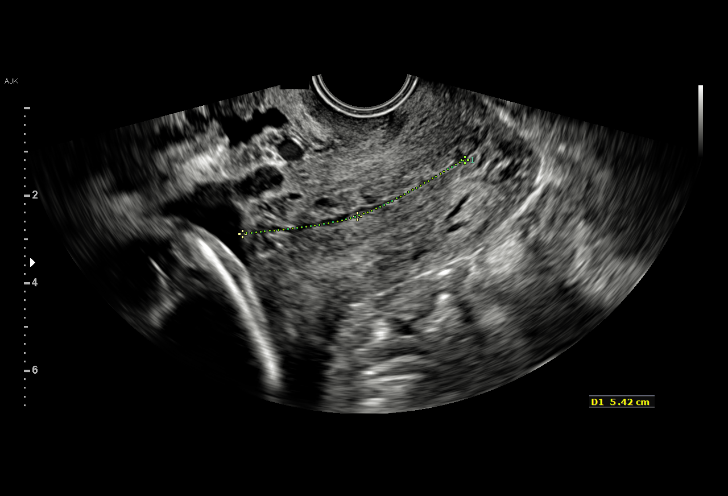
[im 34/44]
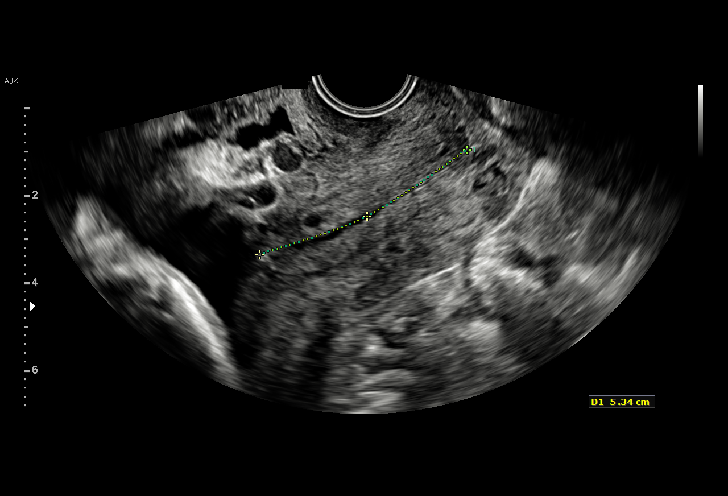
[im 37/44]
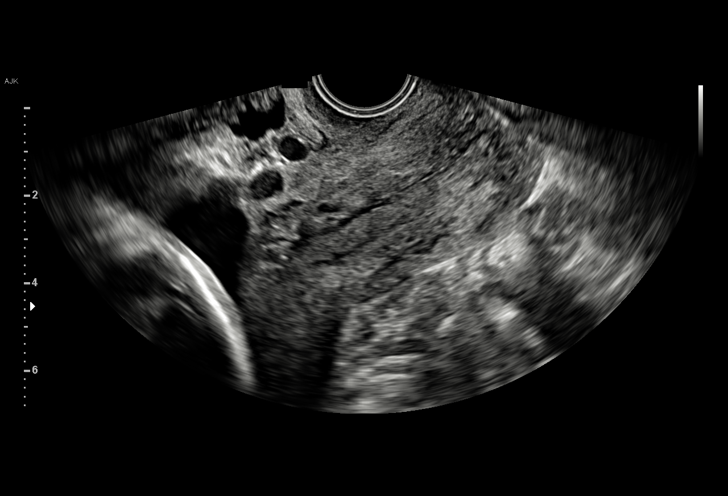
[im 40/44]
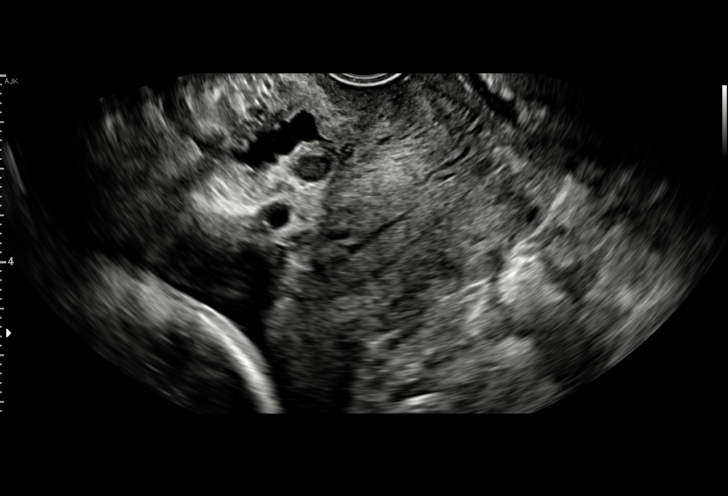
[im 44/44]
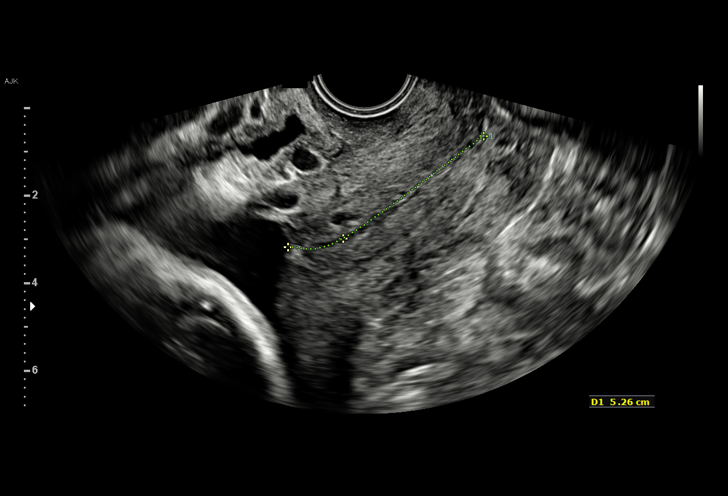

[15 of 28 positions shown; findings below may reference images not displayed]

OB/Gyn Clinic

1  ALAIN SOMOZA            [id number]      [id number]     [id number]
Indications

25 weeks gestation of pregnancy
Obesity complicating pregnancy, second
trimester
Advanced maternal age multigravida 35+,
second trimester (declined further testing)
Poor obstetric history: Previous preterm
delivery, antepartum
Poor obstetric history: Previous
preeclampsia / eclampsia/gestational HTN
History of cesarean delivery, currently
pregnant
Poor obstetric history: Previous fetal growth
restriction (FGR) (SGA infant)
OB History

Blood Type:            Height:  4'7"   Weight (lb):  133      BMI:
Gravidity:    3         Term:   1        Prem:   1        SAB:   0
TOP:          0       Ectopic:  0        Living: 2
Fetal Evaluation
Num Of Fetuses:     1
Fetal Heart         141
Rate(bpm):
Cardiac Activity:   Observed
Presentation:       Cephalic
Placenta:           Posterior, above cervical os
P. Cord Insertion:  Visualized, central

Amniotic Fluid
AFI FV:      Subjectively within normal limits

Largest Pocket(cm)
4.12

Comment:    Bladder, stomach, and diaphragm seen.
Gestational Age

LMP:           25w 6d       Date:   03/05/16                 EDD:   12/10/16
Best:          25w 6d    Det. By:   LMP  (03/05/16)          EDD:   12/10/16
Cervix Uterus Adnexa

Cervix
Length:           5.34  cm.
Normal appearance by transabdominal scan.

Uterus
No abnormality visualized.

Left Ovary
Not visualized.

Right Ovary
Not visualized.

Adnexa:       No abnormality visualized.
Impression

Single IUP at 25w 6d
AMA and history of PTD, here for transvaginal scan
Normal cardiac activity
Transvaginal cervical length 5.3 cm without funneling
Recommendations

Ultrasound for growth in 4 weeks
No need for continued cervical length surveillance - would
follow clinically

## 2018-01-08 IMAGING — US US FETAL BPP W/ NON-STRESS
1 series · 11 of 11 positions shown · non-contrast
Comparison: none

[Series 1: us fetal bpp w/nonstress · 11 acquisitions, 11 frames shown]
[im 1/11]
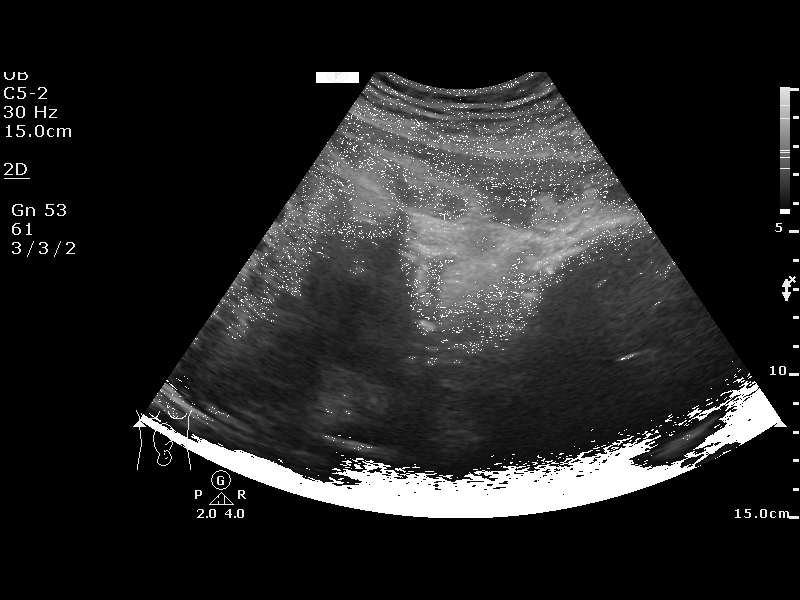
[im 2/11]
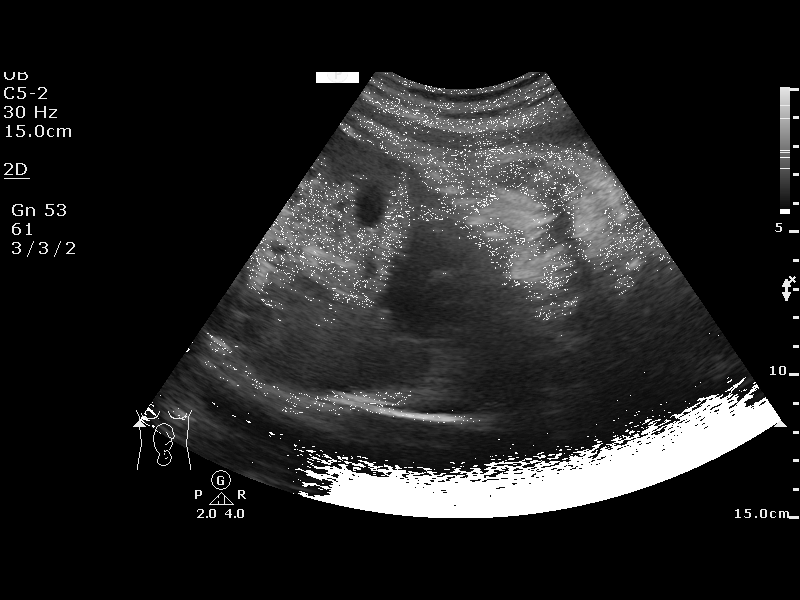
[im 3/11]
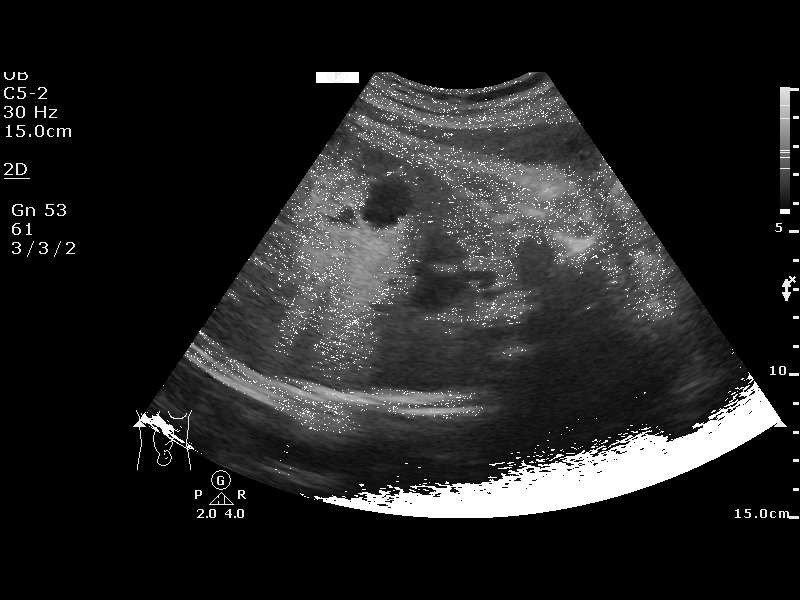
[im 4/11]
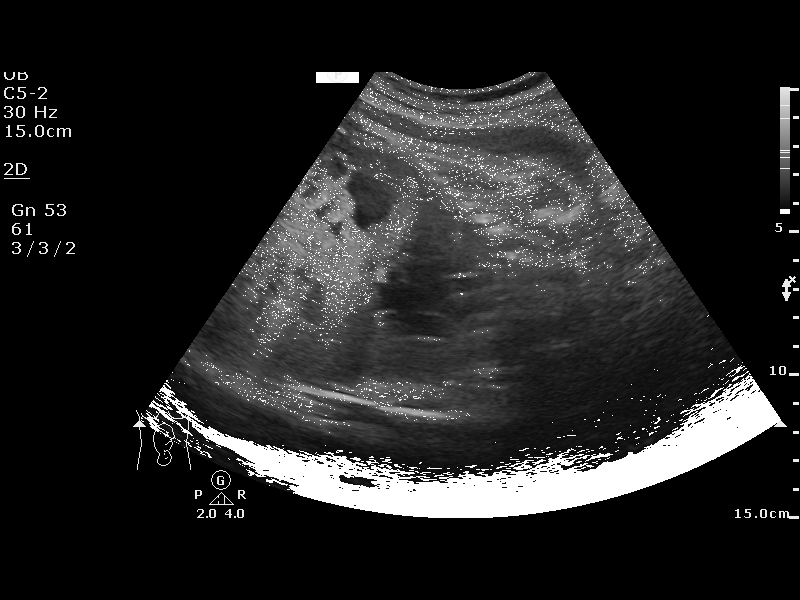
[im 5/11]
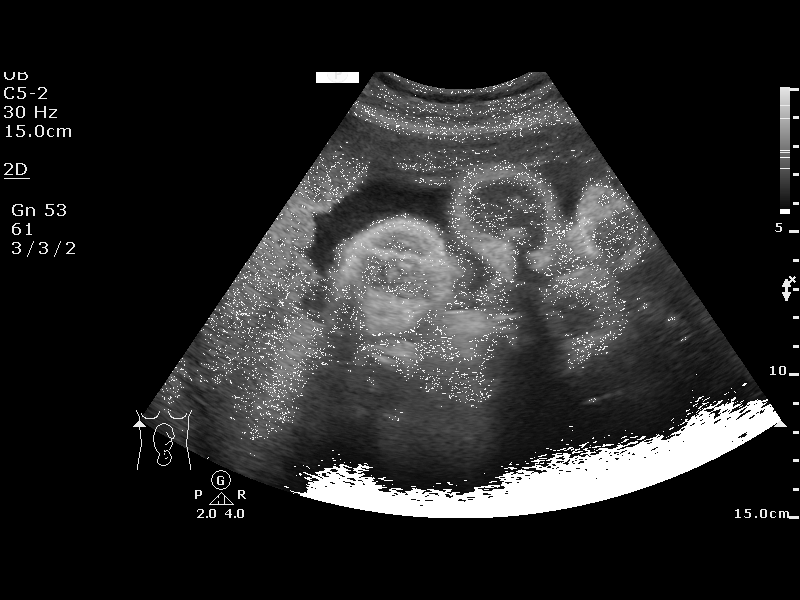
[im 6/11]
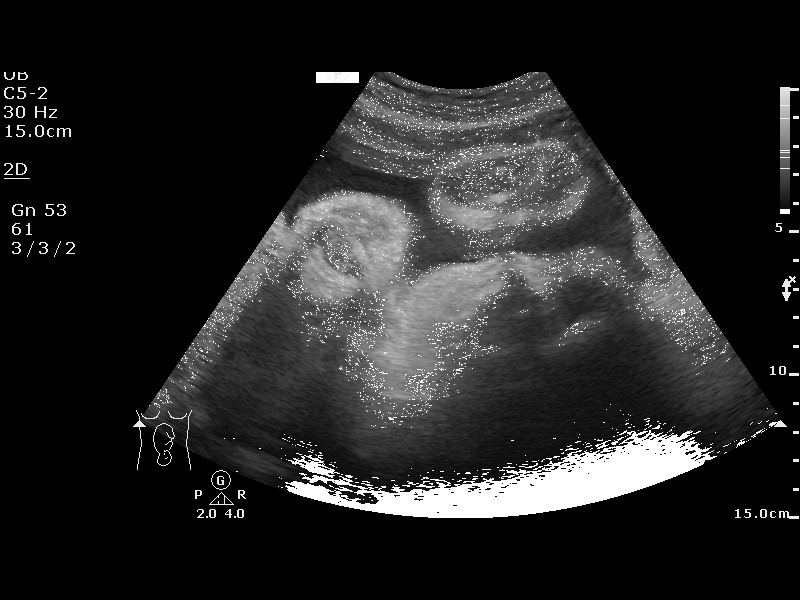
[im 7/11]
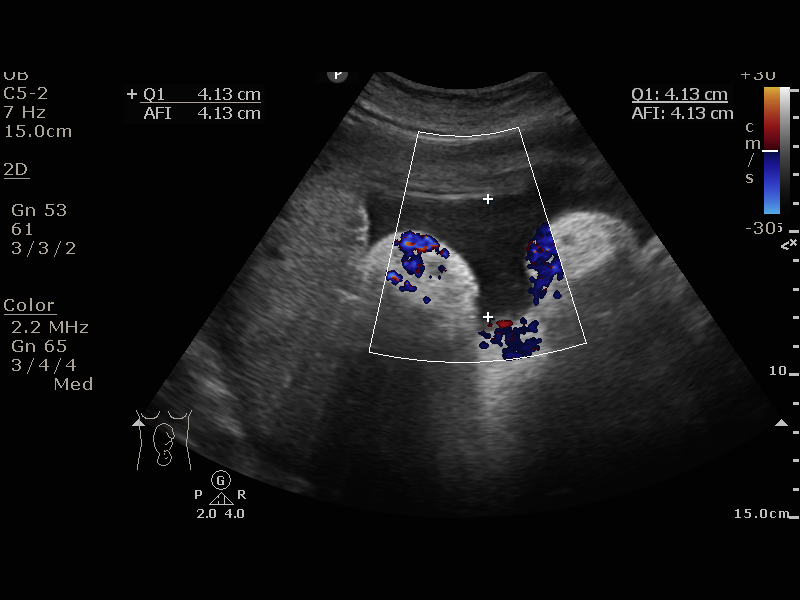
[im 8/11]
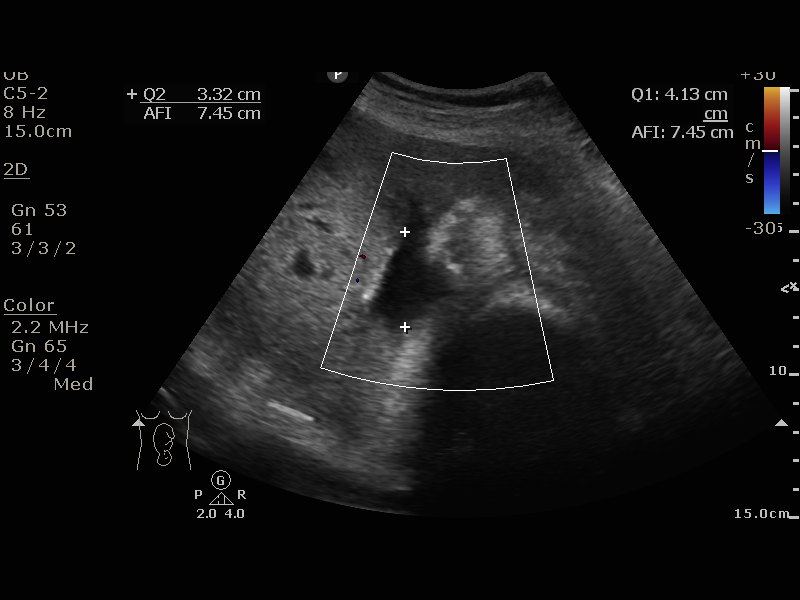
[im 9/11]
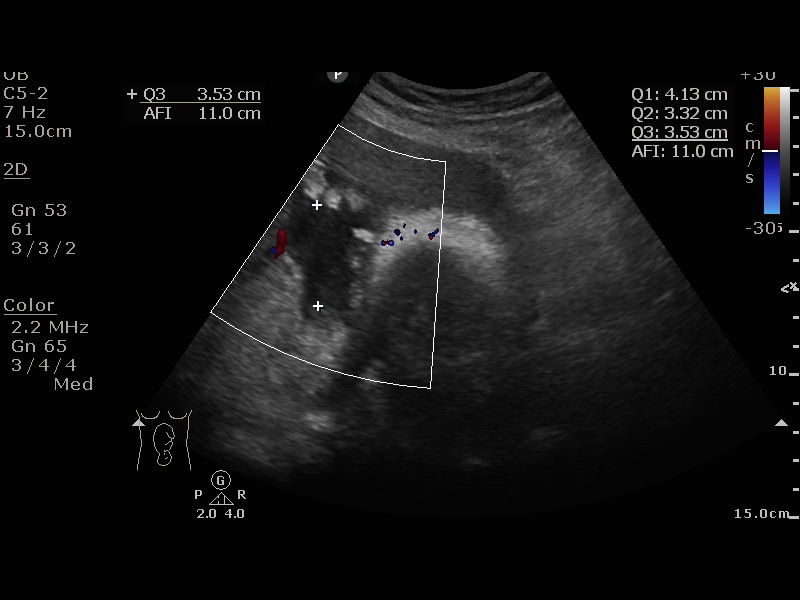
[im 10/11]
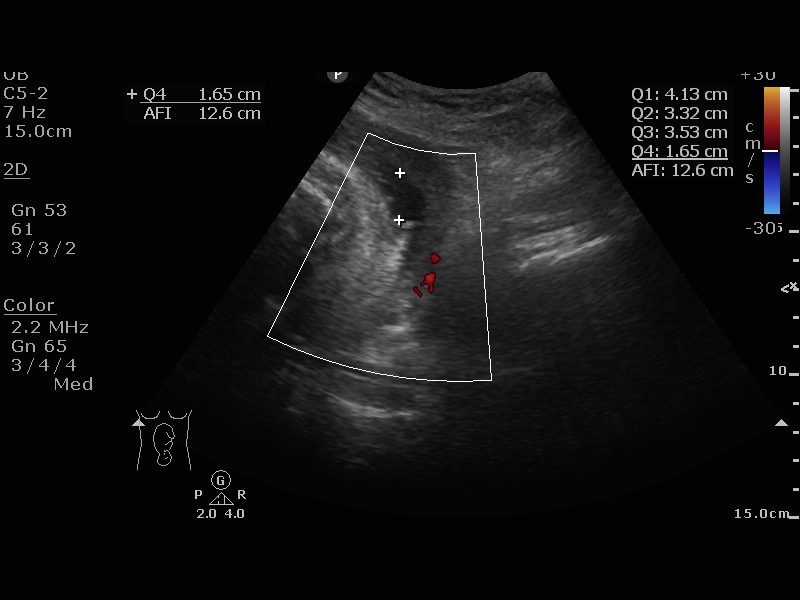
[im 11/11]
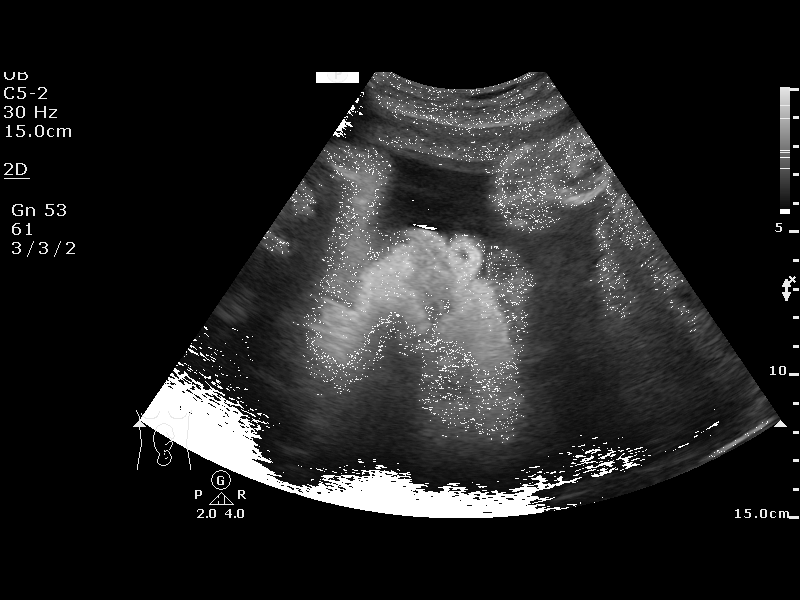

[11 of 11 positions shown; findings below may reference images not displayed]

[facility]                                  [REDACTED[facility]
OB[facility]Gyn Clinic

1  US FETAL BPP W/NONSTRESS                    76818.4

ALAIN DANIEL
Service(s) Provided

Indications

36 weeks gestation of pregnancy
Gestational diabetes in pregnancy,
controlled by oral hypoglycemic drugs
OB History

Blood Type:            Height:  4'7"   Weight (lb):  133       BMI:
Gravidity:    3         Term:   1        Prem:   1        SAB:   0
TOP:          0       Ectopic:  0        Living: 2
Fetal Evaluation

Num Of Fetuses:     1
Preg. Location:     Intrauterine
Cardiac Activity:   Observed
Presentation:       Cephalic
Amniotic Fluid
AFI FV:      Subjectively within normal limits

AFI Sum(cm)     %Tile       Largest Pocket(cm)
12.63           43

RUQ(cm)       RLQ(cm)       LUQ(cm)        LLQ(cm)
4.13
Biophysical Evaluation

Amniotic F.V:   Pocket => 2 cm two         F. Tone:        Observed
planes
F. Movement:    Not Observed               N.S.T:          Reactive
F. Breathing:   Observed                   Score:          [DATE]
Gestational Age

LMP:           36w 5d        Date:  03/05/16                 EDD:   12/10/16
Best:          36w 5d     Det. By:  LMP  (03/05/16)          EDD:   12/10/16
Impression

BPP [DATE]. Continue antenatal testing.

## 2018-01-17 IMAGING — US US FETAL BPP W/ NON-STRESS
1 series · 12 of 12 positions shown · non-contrast
Comparison: none

[Series 1: us fetal bpp w/nonstress · 12 acquisitions, 12 frames shown]
[im 1/12]
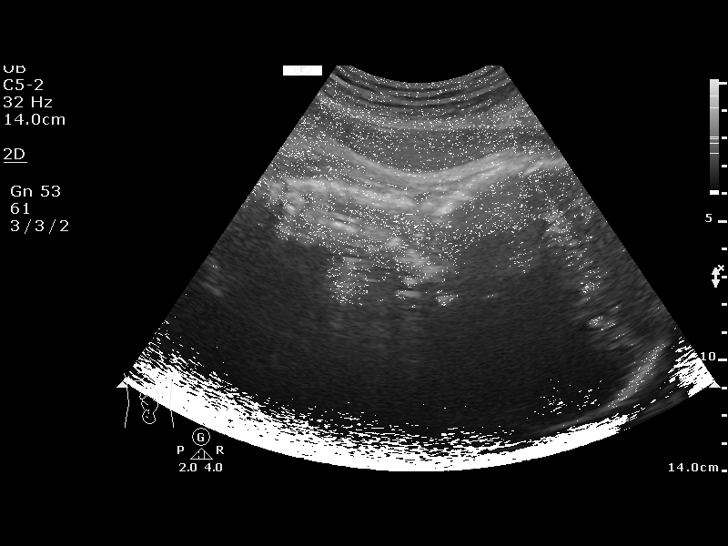
[im 2/12]
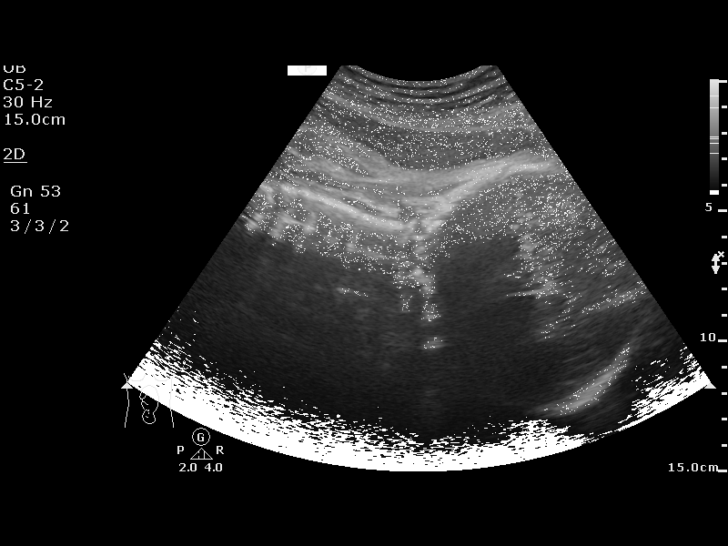
[im 3/12]
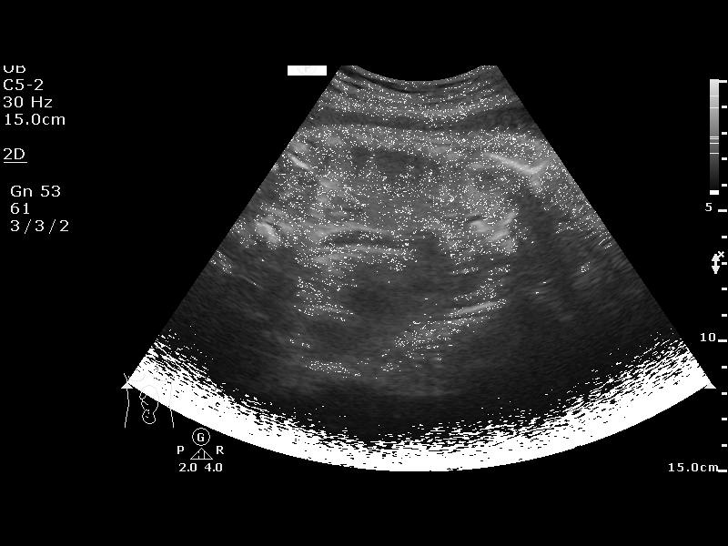
[im 4/12]
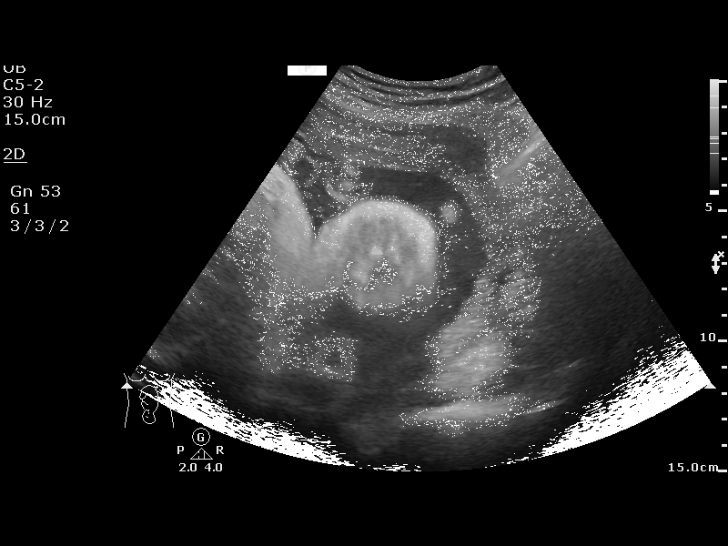
[im 5/12]
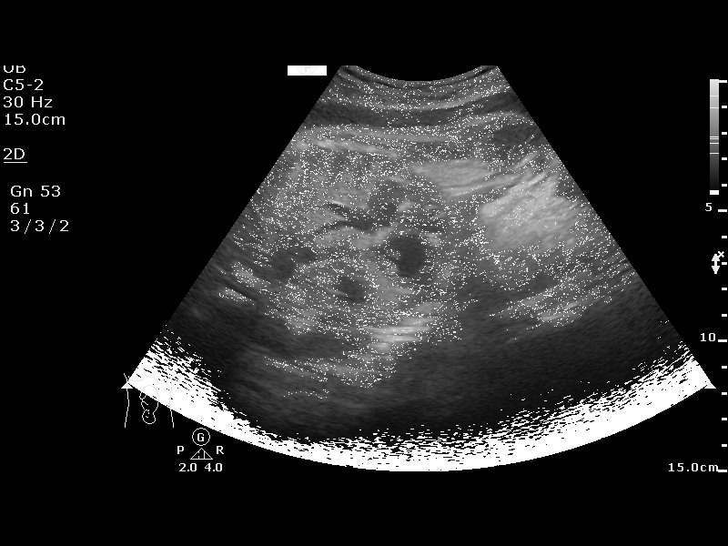
[im 6/12]
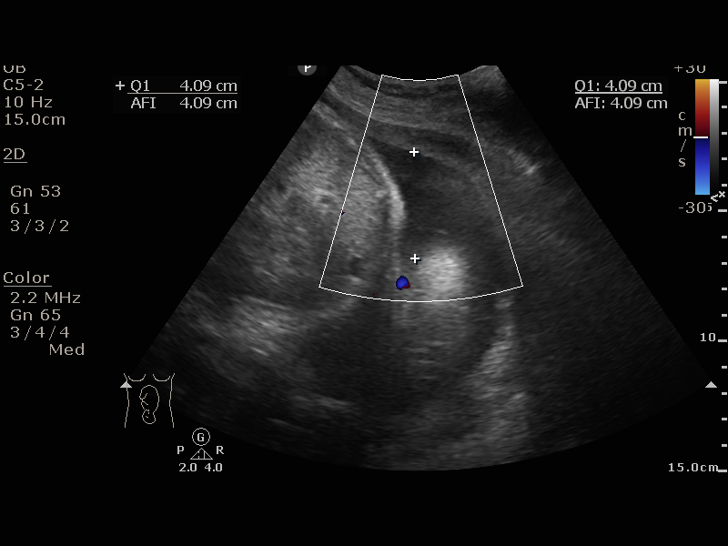
[im 7/12]
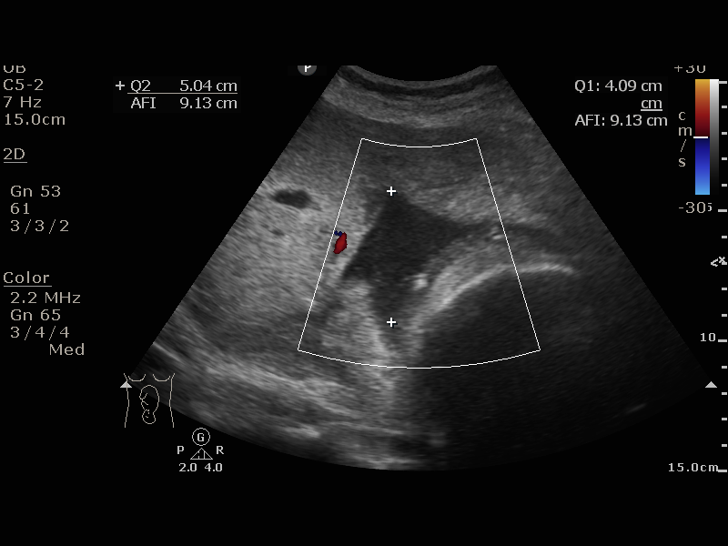
[im 8/12]
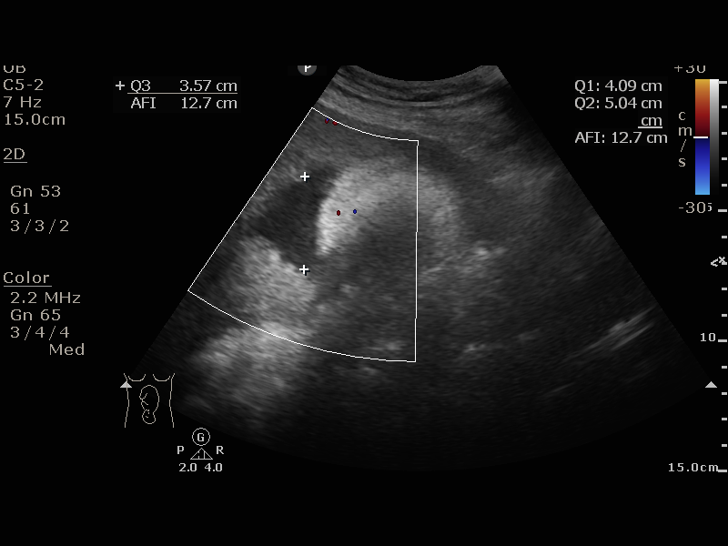
[im 9/12]
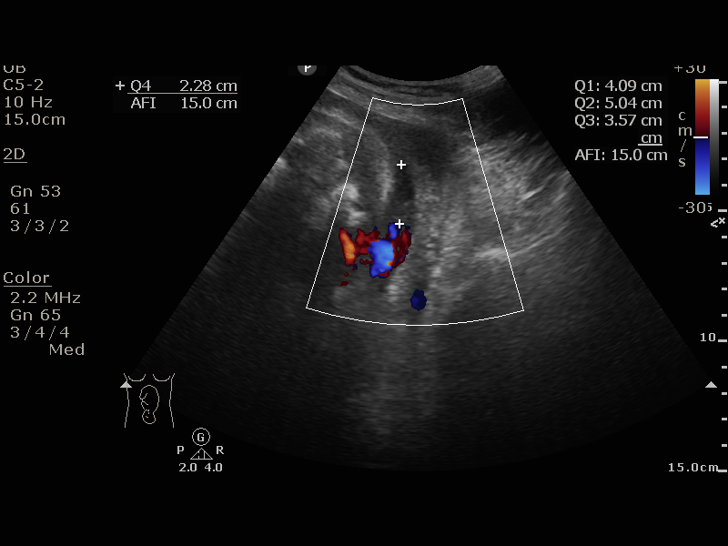
[im 10/12]
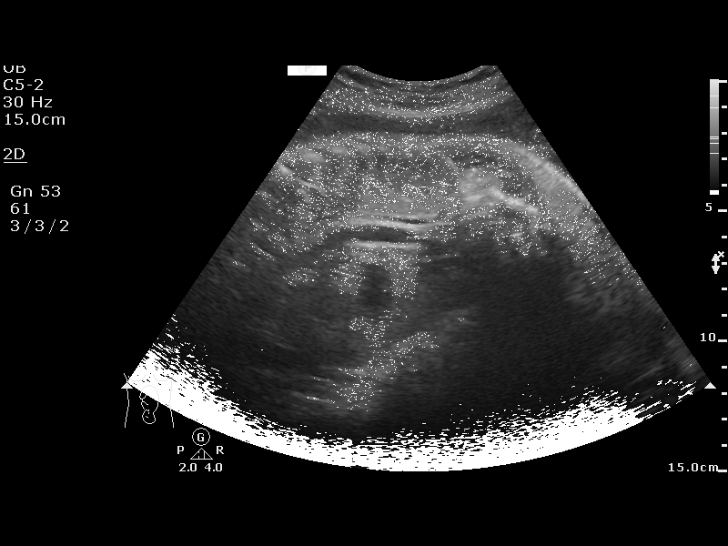
[im 11/12]
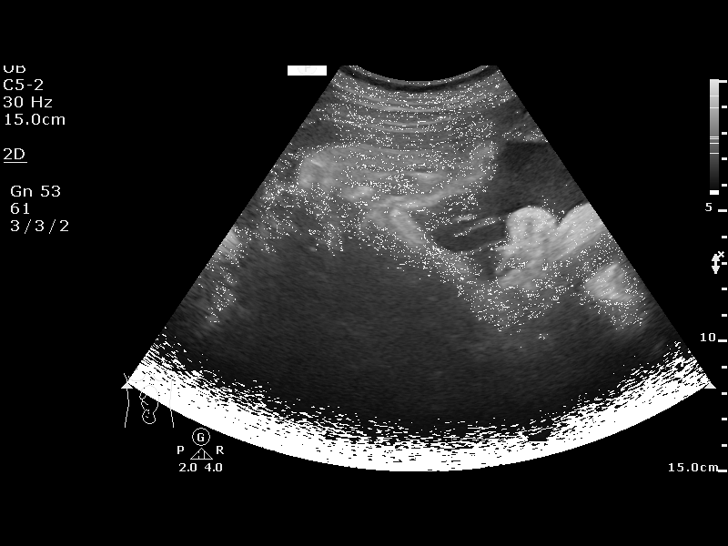
[im 12/12]
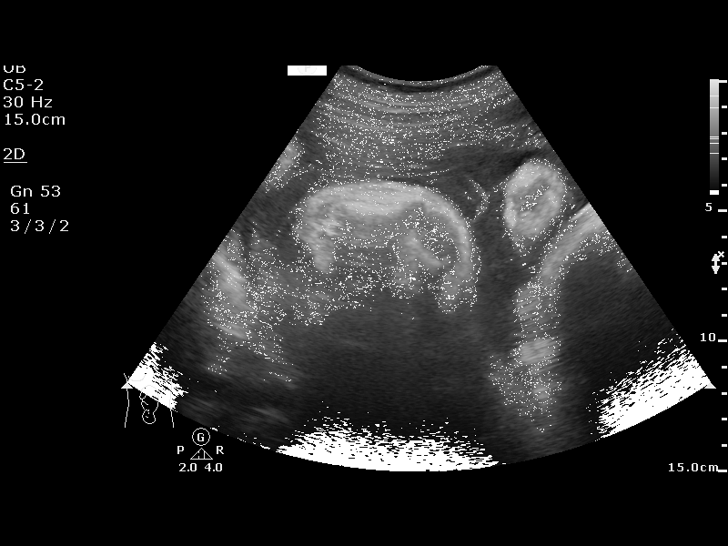

[12 of 12 positions shown; findings below may reference images not displayed]

[facility]                                  [REDACTED]
Healthcare
OB/Gyn Clinic

1  US FETAL BPP W/NONSTRESS                    76818.4

1  YOVANY DUPREE              [id number]      [id number]     [id number]
Service(s) Provided

Indications

38 weeks gestation of pregnancy
Gestational diabetes in pregnancy,
controlled by oral hypoglycemic drugs
OB History

Blood Type:            Height:  4'7"   Weight (lb):  133       BMI:
Gravidity:    3         Term:   1        Prem:   1        SAB:   0
TOP:          0       Ectopic:  0        Living: 2
Fetal Evaluation

Num Of Fetuses:     1
Preg. Location:     Intrauterine
Cardiac Activity:   Observed
Presentation:       Cephalic
Amniotic Fluid
AFI FV:      Subjectively within normal limits

AFI Sum(cm)     %Tile       Largest Pocket(cm)
14.98           57

RUQ(cm)       RLQ(cm)       LUQ(cm)        LLQ(cm)
4.09

Comment:    Breathing noted intermittently, but not sustained.
Biophysical Evaluation

Amniotic F.V:   Pocket => 2 cm two         F. Tone:        Observed
planes
F. Movement:    Observed                   N.S.T:          Reactive
F. Breathing:   Not Observed               Score:          [DATE]
Gestational Age

LMP:           38w 0d        Date:  03/05/16                 EDD:   12/10/16
Best:          38w 0d     Det. By:  LMP  (03/05/16)          EDD:   12/10/16
Impression

Reactive NST, BPP [DATE], intermittent breathing noted
Recommendations

Cont weekly BPP
IOL scheduled for 12/03/16

## 2020-02-27 ENCOUNTER — Other Ambulatory Visit: Payer: Self-pay | Admitting: Obstetrics and Gynecology

## 2020-02-27 DIAGNOSIS — Z1231 Encounter for screening mammogram for malignant neoplasm of breast: Secondary | ICD-10-CM

## 2020-03-28 ENCOUNTER — Ambulatory Visit: Payer: No Typology Code available for payment source | Admitting: *Deleted

## 2020-03-28 ENCOUNTER — Other Ambulatory Visit: Payer: Self-pay

## 2020-03-28 ENCOUNTER — Ambulatory Visit
Admission: RE | Admit: 2020-03-28 | Discharge: 2020-03-28 | Disposition: A | Discharge status: Home / Self Care | Payer: No Typology Code available for payment source | Source: Ambulatory Visit | Attending: Obstetrics and Gynecology | Admitting: Obstetrics and Gynecology

## 2020-03-28 VITALS — BP 118/72 | Wt 139.0 lb

## 2020-03-28 DIAGNOSIS — Z1231 Encounter for screening mammogram for malignant neoplasm of breast: Secondary | ICD-10-CM

## 2020-03-28 DIAGNOSIS — Z1239 Encounter for other screening for malignant neoplasm of breast: Secondary | ICD-10-CM

## 2020-03-28 NOTE — Patient Instructions (Signed)
Explained breast self awareness with Monique Hutchinson. Patient did not need a Pap smear today due to last Pap smear was 02/23/2020. Let her know BCCCP will cover Pap smears every 3 years unless has a history of abnormal Pap smears. Referred patient to the [facility] for a screening mammogram on the mobile unit. Appointment scheduled Thursday, March 28, 2020 at 1500. Escorted patient to the mobile unit following BCCCP appointment for her screening mammogram. Let patient know the Breast Center will follow up with her within the next couple weeks with results of her mammogram by letter or phone. Monique Hutchinson verbalized understanding.  Monique Hutchinson, Monique Maser, RN 2:45 PM

## 2020-03-28 NOTE — Progress Notes (Signed)
Ms. Viha Kriegel is a 45 y.o. female who presents to [facility] clinic today with no complaints.    Pap Smear: Pap smear not completed today. Last Pap smear was 02/23/2020 at the [facility] clinic and was normal with negative HPV. Per patient has history of an abnormal Pap smear in 2015 that a repeat Pap smear was completed for follow-up that was normal. Patient stated she has had at least three normal Pap smears since abnormal Pap smear. Last Pap smear result is not available in Epic.   Physical exam: Breasts Right breast slightly larger than left that per patient is normal for her. No skin abnormalities bilateral breasts. No nipple retraction bilateral breasts. No nipple discharge bilateral breasts. No lymphadenopathy. No lumps palpated bilateral breasts. No complaints of pain or tenderness on exam.      Pelvic/Bimanual Pap is not indicated today per BCCCP guidelines.   Smoking History: Patient has never smoked.   Patient Navigation: Patient education provided. Access to services provided for patient through [place] program. Spanish interpreter Natale Lay from [facility] provided.    Breast and Cervical Cancer Risk Assessment: Patient does not have family history of breast cancer, known genetic mutations, or radiation treatment to the chest before age 65. Patient does not have history of cervical dysplasia, immunocompromised, or DES exposure in-utero.  Risk Assessment    Risk Scores      03/28/2020   Last edited by: Narda Rutherford, LPN   5-year risk: 0.7 %   Lifetime risk: 9.4 %          A: BCCCP exam without pap smear No complaints.  P: Referred patient to the [facility] for a screening mammogram on the mobile unit. Appointment scheduled Thursday, March 28, 2020 at 1500.  Priscille Heidelberg, RN 03/28/2020 2:45 PM

## 2020-09-05 DIAGNOSIS — O039 Complete or unspecified spontaneous abortion without complication: Secondary | ICD-10-CM

## 2020-09-05 HISTORY — DX: Complete or unspecified spontaneous abortion without complication: O03.9

## 2020-09-07 ENCOUNTER — Encounter (HOSPITAL_COMMUNITY): Payer: Self-pay

## 2020-09-07 ENCOUNTER — Inpatient Hospital Stay (HOSPITAL_COMMUNITY): Payer: No Typology Code available for payment source

## 2020-09-07 ENCOUNTER — Encounter (HOSPITAL_COMMUNITY): Payer: Self-pay | Admitting: Obstetrics & Gynecology

## 2020-09-07 ENCOUNTER — Ambulatory Visit (HOSPITAL_COMMUNITY)
Admission: EM | Admit: 2020-09-07 | Discharge: 2020-09-07 | Disposition: A | Discharge status: Home / Self Care | Payer: No Typology Code available for payment source | Attending: Emergency Medicine | Admitting: Emergency Medicine

## 2020-09-07 ENCOUNTER — Other Ambulatory Visit: Payer: Self-pay

## 2020-09-07 ENCOUNTER — Inpatient Hospital Stay (HOSPITAL_COMMUNITY)
Admission: AD | Admit: 2020-09-07 | Discharge: 2020-09-07 | Disposition: A | Discharge status: Home / Self Care | Payer: No Typology Code available for payment source | Source: Ambulatory Visit | Attending: Obstetrics & Gynecology | Admitting: Obstetrics & Gynecology

## 2020-09-07 DIAGNOSIS — Z3201 Encounter for pregnancy test, result positive: Secondary | ICD-10-CM

## 2020-09-07 DIAGNOSIS — Z3A01 Less than 8 weeks gestation of pregnancy: Secondary | ICD-10-CM | POA: Insufficient documentation

## 2020-09-07 DIAGNOSIS — O200 Threatened abortion: Secondary | ICD-10-CM | POA: Insufficient documentation

## 2020-09-07 DIAGNOSIS — O3680X0 Pregnancy with inconclusive fetal viability, not applicable or unspecified: Secondary | ICD-10-CM

## 2020-09-07 DIAGNOSIS — O209 Hemorrhage in early pregnancy, unspecified: Secondary | ICD-10-CM

## 2020-09-07 LAB — CBC
HCT: 40.2 % (ref 36.0–46.0)
Hemoglobin: 13.1 g/dL (ref 12.0–15.0)
MCH: 28.2 pg (ref 26.0–34.0)
MCHC: 32.6 g/dL (ref 30.0–36.0)
MCV: 86.5 fL (ref 80.0–100.0)
Platelets: 311 10*3/uL (ref 150–400)
RBC: 4.65 MIL/uL (ref 3.87–5.11)
RDW: 13.3 % (ref 11.5–15.5)
WBC: 9.7 10*3/uL (ref 4.0–10.5)
nRBC: 0 % (ref 0.0–0.2)

## 2020-09-07 LAB — URINALYSIS, ROUTINE W REFLEX MICROSCOPIC
Bacteria, UA: NONE SEEN
Bilirubin Urine: NEGATIVE
Glucose, UA: NEGATIVE mg/dL
Ketones, ur: NEGATIVE mg/dL
Leukocytes,Ua: NEGATIVE
Nitrite: NEGATIVE
Protein, ur: NEGATIVE mg/dL
Specific Gravity, Urine: 1.012 (ref 1.005–1.030)
pH: 6 (ref 5.0–8.0)

## 2020-09-07 LAB — HCG, QUANTITATIVE, PREGNANCY: hCG, Beta Chain, Quant, S: 308 m[IU]/mL — ABNORMAL HIGH (ref <5)

## 2020-09-07 LAB — POC URINE PREG, ED: Preg Test, Ur: POSITIVE — AB

## 2020-09-07 MED ORDER — IBUPROFEN 600 MG PO TABS
600.0000 mg | ORAL_TABLET | Freq: Once | ORAL | Status: AC
Start: 2020-09-07 — End: 2020-09-07
  Administered 2020-09-07: 600 mg via ORAL
  Filled 2020-09-07: qty 1

## 2020-09-07 MED ORDER — IBUPROFEN 600 MG PO TABS: 600.0000 mg | ORAL_TABLET | Freq: Four times a day (QID) | ORAL | 0 refills | Status: AC | PRN

## 2020-09-07 NOTE — Discharge Instructions (Signed)
Please go straight to Maternity Admissions Unit

## 2020-09-07 NOTE — MAU Provider Note (Signed)
History     CSN: [id number]  Arrival date and time: 09/07/20 1502   Event Date/Time   First Provider Initiated Contact with Patient 09/07/20 1859      Chief Complaint  Patient presents with   Vaginal Bleeding   Abdominal Pain   HPI  [name] is a 45 y.o. [phone] @ Unknown here in MAU with complaints of lower abdomenal pain and vaginal bleeding. She was moving a lot of non heavy boxes at work today and began to have pain in her lower abdomen. The pain was a 7/10 in intensity and was constant. She then noticed some liquidly discharge and then bleeding.  The bleeding has waxed and waned over the last 3 days.   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 No pertinent family history.  Social History   Tobacco Use   Smoking status: Never   Smokeless tobacco: Never  Vaping Use   Vaping Use: Never used  Substance Use Topics   Alcohol use: No    Alcohol/week: 0.0 standard drinks   Drug use: No    Allergies: No Known Allergies  Medications Prior to Admission  Medication Sig Dispense Refill Last Dose   ibuprofen  (ADVIL,MOTRIN) 600 MG tablet Take 1 tablet (600 mg total) by mouth every 6 (six) hours as needed. (Patient not taking: Reported on 01/13/2017) 30 tablet 0    norgestimate-ethinyl estradiol (SPRINTEC 28) 0.25-35 MG-MCG tablet Take 1 tablet by mouth daily. 1 Package 11    oxyCODONE (OXY IR/ROXICODONE) 5 MG immediate release tablet Take 1 tablet (5 mg total) by mouth every 4 (four) hours as needed (pain scale 4-7). (Patient not taking: Reported on 01/13/2017) 50 tablet 0    prenatal vitamin w/FE, FA (PRENATAL 1 + 1) 27-1 MG TABS tablet 1 tab by mouth daily (Patient not taking: Reported on 01/13/2017) 30 each 11    Results for orders placed or performed during the hospital encounter of 09/07/20 (from the past 72 hour(s))  Urinalysis, Routine w reflex microscopic     Status: Abnormal   Collection Time: 09/07/20  3:29 PM  Result Value Ref Range   Color, Urine YELLOW YELLOW   APPearance CLEAR CLEAR   Specific Gravity, Urine 1.012 1.005 - 1.030   pH 6.0 5.0 - 8.0   Glucose, UA NEGATIVE  NEGATIVE mg/dL   Hgb urine dipstick LARGE (A) NEGATIVE   Bilirubin Urine NEGATIVE NEGATIVE   Ketones, ur NEGATIVE NEGATIVE mg/dL   Protein, ur NEGATIVE NEGATIVE mg/dL   Nitrite NEGATIVE NEGATIVE   Leukocytes,Ua NEGATIVE NEGATIVE   RBC / HPF 21-50 0 - 5 RBC/hpf   WBC, UA 0-5 0 - 5 WBC/hpf   Bacteria, UA NONE SEEN NONE SEEN   Squamous Epithelial / LPF 0-5 0 - 5   Mucus PRESENT     Comment: Performed at [facility], [address], [place], [place] [address]  CBC     Status: None   Collection Time: 09/07/20  3:36 PM  Result Value Ref Range   WBC 9.7 4.0 - 10.5 K/uL   RBC 4.65 3.87 - 5.11 MIL/uL   Hemoglobin 13.1 12.0 - 15.0 g/dL   HCT 99.8 33.8 - 25.0 %   MCV 86.5 80.0 - 100.0 fL   MCH 28.2 26.0 - 34.0 pg   MCHC 32.6 30.0 - 36.0 g/dL   RDW 53.9 76.7 - 34.1 %   Platelets 311 150 - 400 K/uL   nRBC 0.0 0.0 - 0.2 %    Comment: Performed at [facility], [address] [place], [place] [address]   hCG, quantitative, pregnancy     Status: Abnormal   Collection Time: 09/07/20  3:36 PM  Result Value Ref Range   hCG, Beta Chain, Quant, S 308 (H) <5 mIU/mL    Comment:          GEST. AGE      CONC.  (mIU/mL)   <=1 WEEK        5 - 50     2 WEEKS       50 - 500     3 WEEKS       100 - 10,000     4 WEEKS     1,000 - 30,000     5 WEEKS     3,500 - 115,000   6-8 WEEKS     12,000 - 270,000    12 WEEKS     15,000 - 220,000        FEMALE AND NON-PREGNANT FEMALE:     LESS THAN 5 mIU/mL Performed at [facility], [address] [place], [place] [address]     US OB LESS THAN 14 WEEKS WITH [place] TRANSVAGINAL  Result Date: 09/07/2020 CLINICAL DATA:  Vaginal bleeding. Quantitative beta HCG is 308. Unknown LMP EXAM: OBSTETRIC <14 WK [place] AND TRANSVAGINAL OB US TECHNIQUE: Both transabdominal and transvaginal ultrasound examinations were performed for complete evaluation of the gestation as well as the maternal uterus, adnexal regions, and pelvic cul-de-sac. Transvaginal technique was performed to assess early pregnancy. COMPARISON:  11/26/2016 FINDINGS: Intrauterine gestational sac: None Yolk sac:  Not Visualized. Embryo:  Not Visualized. Cardiac Activity: Not Visualized. Subchorionic hemorrhage:  None visualized. Maternal uterus/adnexae: Normal appearance of both ovaries. No free pelvic fluid. IMPRESSION: 1. Pregnancy of unknown location. Considerations include completed spontaneous abortion, early intrauterine pregnancy or early ectopic pregnancy. Serial quantitative beta HCG values and follow-up ultrasound are recommended as appropriate to document progression of and location of pregnancy. Ectopic pregnancy has not been excluded. 2. Normal appearance of the ovaries.  No free fluid. Electronically Signed   By: Norva Pavlov M.D.   On: 09/07/2020 18:10     Review of Systems  Constitutional:  Negative for fever.  Gastrointestinal:  Positive for abdominal pain.  Genitourinary:  Positive for vaginal  bleeding.  Physical Exam   Blood pressure 120/73, pulse (!) 57, temperature 97.9 F (36.6 C), temperature source Oral, resp. rate 17, height 4\' 8"  (1.422 m), weight 61.4 kg, last menstrual period 07/06/2020, SpO2 100 %, unknown if currently breastfeeding.  Physical Exam Constitutional:      General: She is not in acute distress.    Appearance: She is well-developed. She is not ill-appearing, toxic-appearing or diaphoretic.  HENT:     Head: Normocephalic.  Abdominal:     Tenderness: There is no abdominal tenderness.  Genitourinary:    Uterus: Enlarged.      Comments: Vagina - Small amount of dark red blood in the vaginal canal Cervix - + active bleeding oozing from the os.   Bimanual exam: Cervix FT GC/Chlam, wet deferred d/t bleeding  Exam by 09/05/2020, NP  Chaperone present for exam.   Skin:    General: Skin is warm.     Capillary Refill: Capillary refill takes more than 3 seconds.  Neurological:     Mental Status: She is alert and oriented to person, place, and time.    MAU Course  Procedures None  MDM O positive blood type  Wet prep & GC HIV, CBC, Hcg, ABO Venia Carbon OB transvaginal    Assessment and Plan   A:  1. Threatened miscarriage   2. Vaginal bleeding affecting early pregnancy   3. Pregnancy of unknown anatomic location     P:  Discharge home in stable condition Unable to schedule f/u quant d/t no appointments available.  Urgent message sent to [facility] for stat quant for Tuesday 8/9  Return to MAU if symptoms worsen Bleeding precautions Pelvic rest Rx: ibuprofen.  10/9, NP 09/07/2020 7:44 PM

## 2020-09-07 NOTE — ED Triage Notes (Signed)
Pt c/o pain in uterus area, lower back and vaginal leaking, that happened 2 days ago. Pt states she is 2 months pregnant and would like to know if she is still pregnant due to having leaking 2 days ago. Pt states the fluid was clear and then had blood.

## 2020-09-07 NOTE — MAU Note (Signed)
Pt sent from Urgent Care for further eval. Thurs, while at work, she started to feel odd. +HPT a month ago. Got a cramp in lower abd while at work.  Went to the bathroom, felt like something was coming out, no blood, not urine- was a clear fluid, saw something that looked like a blood clot, but had no bleeding. ? If fetus, was approx 1.5 in x.5in.  started cramping and bleeding after that. Yesterday she went to dr. (Walk in clinic) +preg test.  Phone call this morning, was told preg hormone was low. Went to UC, preg test was still positive and they sent her here.

## 2020-09-07 NOTE — ED Notes (Signed)
Patient is being discharged from the Urgent Care and sent to the Emergency Department via personal vehicle. Per Karie Schwalbe FNP, patient is in need of higher level of care due to patients reason for visit (pregnancy) Patient is aware and verbalizes understanding of plan of care.  Vitals:   09/07/20 1346  BP: 105/64  Pulse: 63  Resp: 18  Temp: 98.6 F (37 C)  SpO2: 99%

## 2020-09-10 ENCOUNTER — Ambulatory Visit (INDEPENDENT_AMBULATORY_CARE_PROVIDER_SITE_OTHER): Payer: Self-pay

## 2020-09-10 ENCOUNTER — Other Ambulatory Visit: Payer: Self-pay

## 2020-09-10 ENCOUNTER — Telehealth: Payer: Self-pay

## 2020-09-10 VITALS — BP 124/63 | HR 61 | Wt 135.5 lb

## 2020-09-10 DIAGNOSIS — O209 Hemorrhage in early pregnancy, unspecified: Secondary | ICD-10-CM

## 2020-09-10 LAB — BETA HCG QUANT (REF LAB): hCG Quant: 52 m[IU]/mL

## 2020-09-10 NOTE — Progress Notes (Signed)
Results are back. Reviewed with Dr Crissie Reese. Pt advised to have 1 week f/u for non-stat beta.   Call placed to pt with interpreter Claudia. Spoke with pt. Pt advised to have 1 week non-stat beta per Dr Crissie Reese. Pt verbalized understanding and agreeable to plan of care.  Pt has 1 week lab visit scheduled for 8/16 at 10am. Pt aware.   Judeth Cornfield, RN

## 2020-09-10 NOTE — Progress Notes (Signed)
Pt here today for STAT Beta from MAU follow up on 09/07/20.  Pt states having small vaginal bleeding today, but lighter than on Saturday at ER. Pt denies any cramps/severe abd pain.  Pt advised after results come back from Beta, will be contacted via phone with plan of care per Dr Crissie Reese. Pt verbalized understanding.    Judeth Cornfield, RN

## 2020-09-10 NOTE — Telephone Encounter (Addendum)
-----   Message from Pennie Banter sent at 09/09/2020  4:01 PM EDT ----- Regarding: FW: STAT quant needed, MAU f/u  ----- Message ----- From: Duane Lope, NP Sent: 09/07/2020   7:45 PM EDT To: Donnella Bi [place] Subject: STAT quant needed, MAU f/u                     Spanish speaking, patient is early pregnancy with PUL> She needs a nurse visit on 8/9 for stat hcg level. Unable to schedule d/t schedule full.  Thank you,  Victorino Dike, NP   ----------------------------------------------  Called pt with interpreter Debarah Crape. Explained pt needs appt for lab work today. Pt agreeable to 10:40 AM appt today. Front office notified to schedule.

## 2020-09-10 NOTE — Progress Notes (Signed)
Chart reviewed for nurse visit. Agree with plan of care.   Significant drop in beta, technically PUL but almost certainly miscarriage already in process at first visit.  Venora Maples, MD 09/10/20 9:50 PM

## 2020-09-17 ENCOUNTER — Other Ambulatory Visit: Payer: Self-pay | Admitting: General Practice

## 2020-09-17 ENCOUNTER — Other Ambulatory Visit: Payer: No Typology Code available for payment source

## 2020-09-17 ENCOUNTER — Other Ambulatory Visit: Payer: Self-pay

## 2020-09-17 DIAGNOSIS — O0990 Supervision of high risk pregnancy, unspecified, unspecified trimester: Secondary | ICD-10-CM

## 2020-09-18 ENCOUNTER — Telehealth: Payer: Self-pay

## 2020-09-18 LAB — BETA HCG QUANT (REF LAB): hCG Quant: 5 m[IU]/mL

## 2020-09-18 NOTE — Telephone Encounter (Signed)
-----   Message from Venora Maples, MD sent at 09/18/2020  8:53 AM EDT ----- PUL hcg trend complete, no further testing required, please notify patient (non-English speaking)

## 2020-09-18 NOTE — Telephone Encounter (Signed)
Called pt with Monique Hutchinson as interpreter. Pt given results and recommendations per Dr Crissie Reese. Pt verbalized understanding and agreeable to plan of care.   Judeth Cornfield, RN

## 2020-09-30 ENCOUNTER — Telehealth: Payer: Self-pay

## 2020-09-30 ENCOUNTER — Other Ambulatory Visit: Payer: Self-pay

## 2020-09-30 DIAGNOSIS — N644 Mastodynia: Secondary | ICD-10-CM

## 2020-09-30 NOTE — Telephone Encounter (Signed)
Error

## 2020-10-15 ENCOUNTER — Other Ambulatory Visit: Payer: Self-pay

## 2020-10-15 ENCOUNTER — Ambulatory Visit: Payer: No Typology Code available for payment source

## 2020-10-15 ENCOUNTER — Ambulatory Visit: Payer: Self-pay | Admitting: *Deleted

## 2020-10-15 ENCOUNTER — Telehealth: Payer: Self-pay

## 2020-10-15 VITALS — BP 104/66 | Wt 138.5 lb

## 2020-10-15 DIAGNOSIS — N644 Mastodynia: Secondary | ICD-10-CM

## 2020-10-15 DIAGNOSIS — Z1239 Encounter for other screening for malignant neoplasm of breast: Secondary | ICD-10-CM

## 2020-10-15 NOTE — Telephone Encounter (Signed)
Via [place], [place] Interpreter Haroldine Laws), Patient informed negative Pap/HPV results, next pap due in 5 years.

## 2020-10-15 NOTE — Patient Instructions (Signed)
Explained breast self awareness with [name]. Patient did not need a Pap smear today due to last Pap smear and HPV typing was 02/23/2020. Let her know BCCCP will cover Pap smears and HPV typing every 5 years unless has a history of abnormal Pap smears. Referred patient to the [facility] for a left breast diagnostic mammogram. Appointment scheduled Thursday, October 17, 2020 at 0940. Patient aware of appointment and will be there. Monique Hutchinson verbalized understanding.  [name], [name], RN 2:47 PM

## 2020-10-15 NOTE — Progress Notes (Signed)
Monique Hutchinson is a 45 y.o. female who presents to [facility] clinic today with complaint of left breast pain x one year that has increased. Patient stated the left breat pain is diffuse that radiates to her back. Patient stated the pain comes and goes. Patient rates the pain at a 3-5 out of 10. Patient denied the pain at previous exam 03/28/2020.   Pap Smear: Pap smear not completed today. Last Pap smear was 02/23/2020 at the [facility] clinic and was normal with negative HPV. Per patient has history of an abnormal Pap smear in 2015 that a repeat Pap smear was completed for follow-up that was normal. Patient stated she has had at least three normal Pap smears since abnormal Pap smear. Last Pap smear result is not available in Epic.   Physical exam: Breasts Breasts symmetrical. No skin abnormalities bilateral breasts. No nipple retraction bilateral breasts. No nipple discharge bilateral breasts. No lymphadenopathy. No lumps palpated bilateral breasts. No complaints of pain or tenderness on exam.  MS DIGITAL SCREENING TOMO BILATERAL  Result Date: 04/01/2020 CLINICAL DATA:  Screening. EXAM: DIGITAL SCREENING BILATERAL MAMMOGRAM WITH TOMOSYNTHESIS AND CAD TECHNIQUE: Bilateral screening digital craniocaudal and mediolateral oblique mammograms were obtained. Bilateral screening digital breast tomosynthesis was performed. The images were evaluated with computer-aided detection. COMPARISON:  None. ACR Breast Density Category c: The breast tissue is heterogeneously dense, which may obscure small masses FINDINGS: There are no findings suspicious for malignancy. The images were evaluated with computer-aided detection. IMPRESSION: No mammographic evidence of malignancy. A result letter of this screening mammogram will be mailed directly to the patient. RECOMMENDATION: Screening mammogram in one year. (Code:SM-B-01Y) BI-RADS CATEGORY  1: Negative. Electronically Signed   By: Harmon Pier M.D.    On: 04/01/2020 15:59        Pelvic/Bimanual Pap is not indicated today per BCCCP guidelines.   Smoking History: Patient has never smoked.   Patient Navigation: Patient education provided. Access to services provided for patient through [place] program. Spanish interpreter Alene Mires from [facility] provided.   Breast and Cervical Cancer Risk Assessment: Patient does not have family history of breast cancer, known genetic mutations, or radiation treatment to the chest before age 32. Patient does not have history of cervical dysplasia, immunocompromised, or DES exposure in-utero.  Risk Assessment     Risk Scores       10/15/2020 03/28/2020   Last edited by: Narda Rutherford, LPN McGill, Sherie Demetrius Charity, LPN   5-year risk: 0.7 % 0.7 %   Lifetime risk: 9.4 % 9.4 %            A: BCCCP exam without pap smear Complaint of left breast pain.  P: Referred patient to the [facility] for a left breast diagnostic mammogram. Appointment scheduled Thursday, October 17, 2020 at 0940.  Priscille Heidelberg, RN 10/15/2020 2:47 PM

## 2020-10-17 ENCOUNTER — Ambulatory Visit: Admission: RE | Admit: 2020-10-17 | Payer: No Typology Code available for payment source | Source: Ambulatory Visit

## 2020-10-17 ENCOUNTER — Other Ambulatory Visit: Payer: Self-pay

## 2020-10-17 ENCOUNTER — Ambulatory Visit
Admission: RE | Admit: 2020-10-17 | Discharge: 2020-10-17 | Disposition: A | Discharge status: Home / Self Care | Payer: No Typology Code available for payment source | Source: Ambulatory Visit | Attending: Obstetrics and Gynecology | Admitting: Obstetrics and Gynecology

## 2020-10-17 DIAGNOSIS — N644 Mastodynia: Secondary | ICD-10-CM

## 2021-02-26 ENCOUNTER — Other Ambulatory Visit: Payer: Self-pay

## 2021-02-26 DIAGNOSIS — Z1231 Encounter for screening mammogram for malignant neoplasm of breast: Secondary | ICD-10-CM

## 2021-03-04 NOTE — Addendum Note (Signed)
Addended by: Demetrius Revel on: 03/04/2021 01:12 PM   Modules accepted: Orders

## 2021-03-07 ENCOUNTER — Other Ambulatory Visit: Payer: Self-pay | Admitting: Obstetrics and Gynecology

## 2021-03-07 DIAGNOSIS — Z1231 Encounter for screening mammogram for malignant neoplasm of breast: Secondary | ICD-10-CM

## 2021-04-24 ENCOUNTER — Ambulatory Visit
Admission: RE | Admit: 2021-04-24 | Discharge: 2021-04-24 | Disposition: A | Discharge status: Home / Self Care | Payer: No Typology Code available for payment source | Source: Ambulatory Visit | Attending: Obstetrics and Gynecology | Admitting: Obstetrics and Gynecology

## 2021-04-24 ENCOUNTER — Ambulatory Visit: Payer: Self-pay | Admitting: *Deleted

## 2021-04-24 ENCOUNTER — Other Ambulatory Visit: Payer: Self-pay

## 2021-04-24 VITALS — BP 120/64 | Wt 143.9 lb

## 2021-04-24 DIAGNOSIS — Z1239 Encounter for other screening for malignant neoplasm of breast: Secondary | ICD-10-CM

## 2021-04-24 DIAGNOSIS — Z1231 Encounter for screening mammogram for malignant neoplasm of breast: Secondary | ICD-10-CM

## 2021-04-24 NOTE — Patient Instructions (Signed)
Explained breast self awareness with Monique Hutchinson. Patient did not need a Pap smear today due to last Pap smear and HPV typing was 02/23/2020. Let her know BCCCP will cover Pap smears and HPV typing every 5 years unless has a history of abnormal Pap smears. Referred patient to the [place] for a screening mammogram on mobile unit. Appointment scheduled Thursday, April 24, 2021 at 1500. Patient aware of appointment and will be there. Let patient know the Breast Center will follow up with her within the next couple weeks with results of mammogram by letter or phone. Monique Hutchinson verbalized understanding. ? ?Monique Hutchinson, Arvil Chaco, RN ?2:31 PM ? ? ? ? ?

## 2021-04-24 NOTE — Progress Notes (Signed)
[name] is a 46 y.o. female who presents to [facility] clinic today with complaint of left breast pain x 1.5 years that has increased. Patient stated the left breat pain is diffuse. Patient stated the pain comes and goes with sometimes constant. Patient rates the pain at a 7 out of 10. Patient complained of left breast pain on 10/15/2020 and a left diagnostic mammogram was completed for follow up 10/17/2020 that was negative with a screening mammogram recommended in one year.  ?  ?Pap Smear: Pap smear not completed today. Last Pap smear was 1/21/20233 at the [facility] clinic and was normal with negative HPV. Per patient has history of an abnormal Pap smear in 2015 that a repeat Pap smear was completed for follow-up that was normal. Patient stated she has had at least three normal Pap smears since abnormal Pap smear. Last Pap smear result is not available in Epic. Last Pap smear result will be scanned into Epic. ?  ?Physical exam: ?Breasts ?Breasts symmetrical. No skin abnormalities bilateral breasts. No nipple retraction bilateral breasts. No nipple discharge bilateral breasts. No lymphadenopathy. No lumps palpated bilateral breasts. No complaints of pain or tenderness on exam.     ? ?MS DIGITAL SCREENING TOMO BILATERAL ? ?Result Date: 04/01/2020 ?CLINICAL DATA:  Screening. EXAM: DIGITAL SCREENING BILATERAL MAMMOGRAM WITH TOMOSYNTHESIS AND CAD TECHNIQUE: Bilateral screening digital craniocaudal and mediolateral oblique mammograms were obtained. Bilateral screening digital breast tomosynthesis was performed. The images were evaluated with computer-aided detection. COMPARISON:  None. ACR Breast Density Category c: The breast tissue is heterogeneously dense, which may obscure small masses FINDINGS: There are no findings suspicious for malignancy. The images were evaluated with computer-aided detection. IMPRESSION: No mammographic evidence of malignancy. A result letter of this screening  mammogram will be mailed directly to the patient. RECOMMENDATION: Screening mammogram in one year. (Code:SM-B-01Y) BI-RADS CATEGORY  1: Negative. Electronically Signed   By: Harmon Pier M.D.   On: 04/01/2020 15:59  ? ?MS DIGITAL DIAG TOMO UNI LEFT ? ?Result Date: 10/17/2020 ?CLINICAL DATA:  46 year old female with diffuse LEFT breast pain for 10 months. EXAM: DIGITAL DIAGNOSTIC UNILATERAL LEFT MAMMOGRAM WITH TOMOSYNTHESIS AND CAD TECHNIQUE: Left digital diagnostic mammography and breast tomosynthesis was performed. The images were evaluated with computer-aided detection. COMPARISON:  Previous exam(s). ACR Breast Density Category c: The breast tissue is heterogeneously dense, which may obscure small masses. FINDINGS: 2D/3D full field and spot compression views of the LEFT breast demonstrate no suspicious mass, distortion or worrisome calcifications. IMPRESSION: No mammographic evidence of LEFT breast malignancy. RECOMMENDATION: Bilateral screening mammogram in 5 months to resume annual mammogram schedule. I have discussed the findings, causes and remedies of breast pain and recommendations with the patient. If applicable, a reminder letter will be sent to the patient regarding the next appointment. BI-RADS CATEGORY  1: Negative. Electronically Signed   By: Harmon Pier M.D.   On: 10/17/2020 11:18  ?  ?Pelvic/Bimanual ?Pap is not indicated today per BCCCP guidelines. ?  ?Smoking History: ?Patient has never smoked. ?  ?Patient Navigation: ?Patient education provided. Access to services provided for patient through [place] program. Spanish interpreter Monique Hutchinson from [facility] provided.  ?  ?Breast and Cervical Cancer Risk Assessment: ?Patient does not have family history of breast cancer, known genetic mutations, or radiation treatment to the chest before age 52. Patient does not have history of cervical dysplasia, immunocompromised, or DES exposure in-utero. ? ?Risk Assessment   ? ? Risk Scores   ? ?  04/24/2021  10/15/2020  ? Last edited by: Narda Rutherford, LPN McGill, Fidel Levy, LPN  ? 5-year risk: 0.7 % 0.7 %  ? Lifetime risk: 9.4 % 9.4 %  ? ?  ?  ? ?  ? ? ?A: ?BCCCP exam without pap smear ?Complaint of left breast pain. ? ?P: ?Referred patient to the [facility] for a screening mammogram on mobile unit. Appointment scheduled Thursday, April 24, 2021 at 1500. ? ?Priscille Heidelberg, RN ?04/24/2021 2:30 PM   ?

## 2022-03-11 ENCOUNTER — Other Ambulatory Visit: Payer: Self-pay

## 2022-03-11 ENCOUNTER — Emergency Department (HOSPITAL_COMMUNITY): Payer: No Typology Code available for payment source

## 2022-03-11 ENCOUNTER — Encounter (HOSPITAL_COMMUNITY): Payer: Self-pay

## 2022-03-11 ENCOUNTER — Emergency Department (HOSPITAL_COMMUNITY)
Admission: EM | Admit: 2022-03-11 | Discharge: 2022-03-11 | Disposition: A | Discharge status: Home / Self Care | Payer: No Typology Code available for payment source | Attending: Emergency Medicine | Admitting: Emergency Medicine

## 2022-03-11 DIAGNOSIS — R002 Palpitations: Secondary | ICD-10-CM

## 2022-03-11 LAB — CBC
HCT: 43.5 % (ref 36.0–46.0)
Hemoglobin: 14.4 g/dL (ref 12.0–15.0)
MCH: 27.7 pg (ref 26.0–34.0)
MCHC: 33.1 g/dL (ref 30.0–36.0)
MCV: 83.7 fL (ref 80.0–100.0)
Platelets: 387 10*3/uL (ref 150–400)
RBC: 5.2 MIL/uL — ABNORMAL HIGH (ref 3.87–5.11)
RDW: 12.5 % (ref 11.5–15.5)
WBC: 10.3 10*3/uL (ref 4.0–10.5)
nRBC: 0 % (ref 0.0–0.2)

## 2022-03-11 LAB — TROPONIN I (HIGH SENSITIVITY): Troponin I (High Sensitivity): <2 ng/L (ref <18)

## 2022-03-11 LAB — BASIC METABOLIC PANEL
Anion gap: 11 (ref 5–15)
BUN: 7 mg/dL (ref 6–20)
CO2: 23 mmol/L (ref 22–32)
Calcium: 9 mg/dL (ref 8.9–10.3)
Chloride: 104 mmol/L (ref 98–111)
Creatinine, Ser: 0.58 mg/dL (ref 0.44–1.00)
GFR, Estimated: >60 mL/min (ref >60)
Glucose, Bld: 104 mg/dL — ABNORMAL HIGH (ref 70–99)
Potassium: 4.3 mmol/L (ref 3.5–5.1)
Sodium: 138 mmol/L (ref 135–145)

## 2022-03-11 LAB — I-STAT BETA HCG BLOOD, ED (MC, WL, AP ONLY): I-stat hCG, quantitative: 5.9 m[IU]/mL — ABNORMAL HIGH (ref <5)

## 2022-03-11 NOTE — ED Triage Notes (Signed)
Reports palpitations and feeling as though her heart is racing x 1 month.  Denies sob/n/v Patient has not seen a cardiologist.

## 2022-03-11 NOTE — ED Provider Notes (Signed)
[facility] Provider Note   CSN: [id number] Arrival date & time: 03/11/22  0827     History  Chief Complaint  Patient presents with   Palpitations    Monique Hutchinson is a 47 y.o. female with medical history of GERD, osteomyelitis.  Patient presents to the ED for evaluation of palpitations.  Patient reports that for the last 1 month she has had what she feels to be palpitations in her chest, heart fluttering.  Patient reports that these episodes will occur every other day, denies that they are daily.  Patient states that they will last for 30 seconds and then resolve.  Patient denies any history of cardiac disease, seeing cardiology.  Patient denies any syncope, shortness of breath, chest pain, one-sided weakness or numbness, abdominal pain. Patient denies nausea, vomiting, fevers.   Palpitations Associated symptoms: no chest pain, no dizziness, no nausea, no numbness, no shortness of breath, no vomiting and no weakness        Home Medications Prior to Admission medications   Medication Sig Start Date End Date Taking? Authorizing Provider  ibuprofen (ADVIL) 600 MG tablet Take 1 tablet (600 mg total) by mouth every 6 (six) hours as needed. 09/07/20   Rasch, Artist Pais, NP      Allergies    Patient has no known allergies.    Review of Systems   Review of Systems  Constitutional:  Negative for fever.  Respiratory:  Negative for shortness of breath.   Cardiovascular:  Positive for palpitations. Negative for chest pain and leg swelling.  Gastrointestinal:  Negative for abdominal pain, nausea and vomiting.  Neurological:  Negative for dizziness, syncope, weakness, light-headedness and numbness.  All other systems reviewed and are negative.   Physical Exam Updated Vital Signs BP 113/78 (BP Location: Right Arm)   Pulse 64   Temp 98.4 F (36.9 C) (Oral)   Resp 16   Ht 4' 8"$  (1.422 m)   Wt 64.9 kg   SpO2 100%   BMI 32.06 kg/m   Physical Exam Vitals and nursing note reviewed.  Constitutional:      General: She is not in acute distress.    Appearance: Normal appearance. She is not ill-appearing, toxic-appearing or diaphoretic.  HENT:     Head: Normocephalic and atraumatic.     Nose: Nose normal. No congestion.     Mouth/Throat:     Mouth: Mucous membranes are moist.     Pharynx: Oropharynx is clear.  Eyes:     Extraocular Movements: Extraocular movements intact.     Conjunctiva/sclera: Conjunctivae normal.     Pupils: Pupils are equal, round, and reactive to light.  Cardiovascular:     Rate and Rhythm: Normal rate and regular rhythm.  Pulmonary:     Effort: Pulmonary effort is normal.     Breath sounds: Normal breath sounds. No wheezing.  Abdominal:     General: Abdomen is flat. Bowel sounds are normal.     Palpations: Abdomen is soft.     Tenderness: There is no abdominal tenderness.  Musculoskeletal:     Cervical back: Normal range of motion and neck supple. No tenderness.  Skin:    General: Skin is warm and dry.     Capillary Refill: Capillary refill takes less than 2 seconds.  Neurological:     General: No focal deficit present.     Mental Status: She is alert and oriented to person, place, and time.     ED  Results / Procedures / Treatments   Labs (all labs ordered are listed, but only abnormal results are displayed) Labs Reviewed  BASIC METABOLIC PANEL - Abnormal; Notable for the following components:      Result Value   Glucose, Bld 104 (*)    All other components within normal limits  CBC - Abnormal; Notable for the following components:   RBC 5.20 (*)    All other components within normal limits  I-STAT BETA HCG BLOOD, ED ([facility], [facility], [facility] ONLY) - Abnormal; Notable for the following components:   I-stat hCG, quantitative 5.9 (*)    All other components within normal limits  TROPONIN I (HIGH SENSITIVITY)    EKG None  Radiology DG Chest 2 View  Result Date: 03/11/2022 CLINICAL DATA:   Palpitations EXAM: CHEST - 2 VIEW COMPARISON:  06/18/2009 FINDINGS: The heart size and mediastinal contours are within normal limits. Both lungs are clear. The visualized skeletal structures are unremarkable. IMPRESSION: No active cardiopulmonary disease. Electronically Signed   By: Davina Poke D.O.   On: 03/11/2022 09:01    Procedures Procedures    Medications Ordered in ED Medications - No data to display  ED Course/ Medical Decision Making/ A&P  Medical Decision Making Amount and/or Complexity of Data Reviewed Labs: ordered. Radiology: ordered.   47 year old female presents to ED.  HPI for further details.  On my examination patient is afebrile, nontachycardic with normal sinus rhythm on EKG.  Patient lung sounds are clear bilaterally, she is not hypoxic on room air.  Abdomen soft and compressible throughout.  Neurological examination shows no focal neurodeficits.  Patient workup will include the following labs and imaging studies: Troponin x 2, CBC, BMP, chest x-ray, EKG  CBC unremarkable.  No leukocytosis or anemia.  BMP unremarkable.  No electrolyte derangement.  Troponin less than 2, patient denies chest pain so no delta will be collected.  Chest x-ray shows no consolidations or effusions.  Patient EKG normal sinus rhythm, nonischemic.  Reviewed by attending Dr. Rogene Houston.  At this time, patient denies chest pain, has had negative delta troponins.  Patient shows no evidence of arrhythmia on EKG.  Patient will be referred to cardiology at this time for further management.  Patient provided return precautions and she voiced understanding.  Patient had all of her questions answered to her satisfaction.  Patient stable for discharge.  Patient interaction supplemented utilizing translator.  Final Clinical Impression(s) / ED Diagnoses Final diagnoses:  Palpitations    Rx / DC Orders ED Discharge Orders     None         Azucena Cecil, PA-C 03/11/22 1152     Fredia Sorrow, MD 03/15/22 1524

## 2022-03-11 NOTE — Progress Notes (Unsigned)
37    Cardiology Office Note   Date:  03/12/2022   ID:  Monique Hutchinson, DOB 1975-03-16, MRN [id number]  PCP:  Mack Hook, MD  Cardiologist:   None Referring:  ED  Chief Complaint  Patient presents with   Palpitations      History of Present Illness: Monique Hutchinson is a 47 y.o. female who is referred by the ED for evaluation of palpitations.  She reports that she has been having palpitations since January.  She is otherwise been feeling well.  She does not describe chest pressure, neck or arm discomfort.  She does not have any shortness of breath, PND or orthopnea.  She is active taking care of 3 children ages 75 and 12 and she works full-time at [employer].  She says that the skipping beats were happening less frequently but then they started happening throughout the day and so she presented to the emergency room yesterday.  This morning she was noted to have premature atrial contractions in a bigeminal pattern.  She feels these though they are not as strong as they were yesterday.  She feels "jumping" in her chest.  She feels a little coughing sensation or need to cough.  She feels little tired with these.  She has not had any presyncope or syncope.  She never had this before.  She has not had any prior cardiac workup.   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 30 tablet 0   metoprolol tartrate (LOPRESSOR) 25 MG tablet Take 0.5 tablets (12.5 mg total) by mouth 2 (two) times daily. 90 tablet 3   No current facility-administered medications for this visit.    Allergies:   Patient has no known allergies.    Social History:  The patient  reports that she has never smoked. She has never used smokeless tobacco. She reports that she does not drink alcohol and does not use drugs.   Family History:  The patient's family history is not on file.    ROS:  Please see the history of present illness.   Otherwise, review of systems are positive for none.   All other systems are reviewed and negative.    PHYSICAL EXAM: VS:  BP 115/77   Pulse 68   Ht 4\' 11"  (1.499 m)   Wt 146 lb (66.2 kg)   SpO2 99%   BMI 29.49 kg/m  , BMI Body mass index is 29.49 kg/m. GENERAL:  Well appearing HEENT:  Pupils equal round and reactive, fundi not visualized, oral mucosa unremarkable NECK:  No jugular venous distention, waveform within  normal limits, carotid upstroke brisk and symmetric, no bruits, no thyromegaly LYMPHATICS:  No cervical, inguinal adenopathy LUNGS:  Clear to auscultation bilaterally BACK:  No CVA tenderness CHEST:  Unremarkable HEART:  PMI not displaced or sustained,S1 and S2 within normal limits, no S3, no S4, no clicks, no rubs, no murmurs ABD:  Flat, positive bowel sounds normal in frequency in pitch, no bruits, no rebound, no guarding, no midline pulsatile mass, no hepatomegaly, no splenomegaly EXT:  2 plus pulses throughout, no edema, no cyanosis no clubbing SKIN:  No rashes no nodules NEURO:  Cranial nerves II through XII grossly intact, motor grossly intact throughout PSYCH:  Cognitively intact, oriented to person  place and time    EKG:  EKG is ordered today. The ekg ordered today demonstrates sinus rhythm with premature atrial contractions in a bigeminal pattern, no acute ST-T wave changes.   Recent Labs: 03/11/2022: BUN 7; Creatinine, Ser 0.58; Hemoglobin 14.4; Platelets 387; Potassium 4.3; Sodium 138    Lipid Panel No results found for: "CHOL", "TRIG", "HDL", "CHOLHDL", "VLDL", "LDLCALC", "LDLDIRECT"    Wt Readings from Last 3 Encounters:  03/12/22 146 lb (66.2 kg)  03/11/22 143 lb (64.9 kg)  04/24/21 143 lb 14.4 oz (65.3 kg)      Other studies Reviewed: Additional studies/ records that were reviewed today include: ED records. Review of the above records demonstrates:  Please see elsewhere in the note.     ASSESSMENT AND PLAN:  Palpitations:   The patient is having premature atrial contractions.  I will check a TSH.  Electrolytes were otherwise unremarkable.  I will check a 2-week monitor.  I would suspect a structurally normal heart.  We discussed conservative therapy versus medication management.  She wants to try medications and I will start metoprolol 12 and half milligrams twice daily.  She can decide in the next month or so whether she wants to continue this.  She is not drinking caffeine.  There is no other triggers that I can identify.  Elevated beta-hCG: I did inquire with one of the primary providers who was seen in the distant past.  I told the patient to follow-up on this result and reviewed it with her.  She does not think she is pregnant.    Current medicines are reviewed at length with the patient today.  The patient does not have concerns regarding medicines.  The following changes have been made:  no change  Labs/ tests ordered today include:   Orders Placed This Encounter  Procedures   TSH   LONG TERM MONITOR (3-14 DAYS)   EKG 12-Lead     Disposition:   FU with APP in one month.     Signed, Minus Breeding, MD  03/12/2022 10:16 AM    [place]

## 2022-03-11 NOTE — Discharge Instructions (Signed)
Regrese al servicio de urgencias si presenta cualquier signo o sntoma nuevo o que empeore, como dolor en el pecho, dificultad para respirar o prdida del conocimiento. Llame al nmero proporcionado y programe una cita para ser atendido por cardiologa para sus palpitaciones. Lea el documento adjunto sobre palpitaciones para obtener ms informacin sobre su diagnstico.  Please return to the ED with any  new or worsening signs or symptoms such as chest pain, shortness of breath, loss of consciousness. Please call the number provided and make an appointment to be seen by cardiology for your palpitations. Please read the attached document concerning palpitations for further information regarding your diagnosis.

## 2022-03-12 ENCOUNTER — Encounter: Payer: Self-pay | Admitting: Cardiology

## 2022-03-12 ENCOUNTER — Ambulatory Visit (INDEPENDENT_AMBULATORY_CARE_PROVIDER_SITE_OTHER): Payer: No Typology Code available for payment source

## 2022-03-12 ENCOUNTER — Ambulatory Visit: Payer: No Typology Code available for payment source | Attending: Cardiology | Admitting: Cardiology

## 2022-03-12 VITALS — BP 115/77 | HR 68 | Ht 59.0 in | Wt 146.0 lb

## 2022-03-12 DIAGNOSIS — R002 Palpitations: Secondary | ICD-10-CM

## 2022-03-12 LAB — TSH: TSH: 1.19 u[IU]/mL (ref 0.450–4.500)

## 2022-03-12 MED ORDER — METOPROLOL TARTRATE 25 MG PO TABS: 12.5000 mg | ORAL_TABLET | Freq: Two times a day (BID) | ORAL | 3 refills | Status: DC

## 2022-03-12 NOTE — Patient Instructions (Addendum)
Medication Instructions:  START metoprolol tartrate 12.5mg  twice daily  *If you need a refill on your cardiac medications before your next appointment, please call your pharmacy*   Lab Work: TSH today   If you have labs (blood work) drawn today and your tests are completely normal, you will receive your results only by: [name] (if you have MyChart) OR A paper copy in the mail If you have any lab test that is abnormal or we need to change your treatment, we will call you to review the results.   Testing/Procedures: [name] Long term monitor-Live Telemetry  Your physician has requested you wear a ZIO patch monitor for 14 days.  This is a single patch monitor. [employer] supplies one patch monitor per enrollment. Additional  stickers are not available.  Please do not apply patch if you will be having a Nuclear Stress Test, Echocardiogram, Cardiac CT, MRI,  or Chest Xray during the period you would be wearing the monitor. The patch cannot be worn during  these tests. You cannot remove and re-apply the [name] patch monitor.  Your ZIO patch monitor will be mailed 3 day USPS to your address on file. It may take 3-5 days to  receive your monitor after you have been enrolled.  Once you have received your monitor, please review the enclosed instructions. Your monitor has  already been registered assigning a specific monitor serial # to you.   Billing and Patient Assistance Program information  Monique Hutchinson has been supplied with any insurance information on record for billing. [employer] offers a sliding scale Patient Assistance Program for patients without insurance, or whose  insurance does not completely cover the cost of the ZIO patch monitor. You must apply for the  Patient Assistance Program to qualify for the discounted rate. To apply, call [employer] at [phone],  select option 4, select option 2 , ask to apply for the Patient Assistance Program, (you can request an  interpreter if  needed). [employer] will ask your household income and how many people are in your  household. [employer] will quote your out-of-pocket cost based on this information. They will also be able  to set up a 12 month interest free payment plan if needed.  Applying the monitor   Shave hair from upper left chest.  Hold the abrader disc by orange tab. Rub the abrader in 40 strokes over left upper chest as indicated in  your monitor instructions.  Clean area with 4 enclosed alcohol pads. Use all pads to ensure the area is cleaned thoroughly. Let  dry.  Apply patch as indicated in monitor instructions. Patch will be placed under collarbone on left side of  chest with arrow pointing upward.  Rub patch adhesive wings for 2 minutes. Remove the white label marked "1". Remove the white label  marked "2". Rub patch adhesive wings for 2 additional minutes.  While looking in a mirror, press and release button in center of patch. A small green light will flash 3-4  times. This will be your only indicator that the monitor has been turned on.  Do not shower for the first 24 hours. You may shower after the first 24 hours.  Press the button if you feel a symptom. You will hear a small click. Record Date, Time and Symptom in  the Patient Log.   Starting the Gateway  In your kit there is a [name] box the size of a cellphone. This is [name]. It transmits all your  recorded data to [name]. This box must always stay within 10 feet of you. Open the box and push the *  button. There will be a light that blinks orange and then green a few times. When the light stops  blinking, the Gateway is connected to the ZIO patch. Call [employer] at [phone] to confirm your monitor is transmitting.  Returning your monitor  Remove your patch and place it inside the [name]. In the lower half of the Gateway there is a white  bag with prepaid postage on it. Place Gateway in bag and seal. Mail package back to [name] as  soon as  possible. Your physician should have your final report approximately 7 days after you have mailed back  your monitor. Call [name] at [phone] if you have questions regarding your [name]  patch monitor. Call them immediately if you see an orange light blinking on your monitor.  If your monitor falls off in less than 4 days, contact our Monitor department at [phone]. If your  monitor becomes loose or falls off after 4 days call [employer] at [phone] for suggestions on  securing your monitor    Follow-Up: At [name], you and your health needs are our priority.  As part of our continuing mission to provide you with exceptional heart care, we have created designated Provider Care Teams.  These Care Teams include your primary Cardiologist (physician) and Advanced Practice Providers (APPs -  Physician Assistants and Nurse Practitioners) who all work together to provide you with the care you need, when you need it.  We recommend signing up for the patient portal called "MyChart".  Sign up information is provided on this After Visit Summary.  MyChart is used to connect with patients for Virtual Visits (Telemedicine).  Patients are able to view lab/test results, encounter notes, upcoming appointments, etc.  Non-urgent messages can be sent to your provider as well.   To learn more about what you can do with MyChart, go to [name].    Your next appointment:    1 month with PA or NP  Metoprolol Tablets Qu es este medicamento? El METOPROLOL trata la presin arterial alta. Tambin previene el dolor de pecho (angina de pecho) o mayor dao despus de un ataque cardiaco. Acta disminuyendo la presin arterial y Environmental health practitioner, y as hace que le resulte ms fcil al corazn bombear sangre al resto del cuerpo. Pertenece a un grupo de medicamentos llamados betabloqueadores. Este medicamento puede ser utilizado para otros  usos; si tiene alguna pregunta consulte con su proveedor de atencin mdica o con su farmacutico. MARCAS COMUNES: Lopressor Qu le debo informar a mi profesional de la salud antes de tomar este medicamento? Necesitan saber si usted presenta alguno de los [employer] o situaciones: Diabetes Enfermedad cardiaca o vascular, como frecuencia cardiaca lenta, empeoramiento de insuficiencia cardiaca, bloqueo cardiaco, sndrome del seno enfermo o enfermedad de Raynaud Enfermedad renal Enfermedad heptica Enfermedad pulmonar o respiratoria, como asma o enfisema Feocromocitoma Enfermedad tiroidea [place] reaccin alrgica o inusual al metoprolol, a otros betabloqueadores, medicamentos, alimentos, colorantes o conservantes Si est embarazada o buscando quedar embarazada Si est amamantando a un beb Cmo debo utilizar este medicamento? Tome este medicamento por va oral con agua. selo segn las instrucciones en la etiqueta a [place]. Puede tomarlo con o sin alimentos. Siempre debe usarlo de la misma forma. Siga usndolo a menos que su equipo de atencin le indique dejar de Nature conservation officer. Hable  con su equipo de atencin sobre el uso de este medicamento en nios. Puede requerir atencin especial. Sobredosis: Pngase en contacto inmediatamente con un centro toxicolgico o una sala de urgencia si usted cree que haya tomado demasiado medicamento. ATENCIN: [employer] es solo para usted. No comparta este medicamento con nadie. Qu sucede si me olvido de una dosis? Si olvida una dosis, adminstrela lo antes posible. Si es casi la hora de la prxima dosis, administre solo esa dosis. No se administre dosis adicionales o dobles. Qu puede interactuar con este medicamento? Este medicamento podra interactuar con los siguientes productos: Ciertos medicamentos para la presin arterial, enfermedad cardiaca y frecuencia cardiaca irregular Ciertos medicamentos para la depresin tales como  inhibidores de la monoamina oxidasa (IMAO), fluoxetina o paroxetina Clonidina Dobutamina Epinefrina Isoproterenol Reserpina Puede ser que esta lista no menciona todas las posibles interacciones. Informe a su profesional de [employer]	[place] de [employer] productos a base de hierbas, medicamentos de [place] o suplementos nutritivos que est tomando. Si usted fuma, consume bebidas alcohlicas o si utiliza drogas ilegales, indqueselo tambin a su profesional de [employer] Home	[place]. Algunas sustancias pueden interactuar con su medicamento. A qu debo estar atento al usar Coca[employer]a? Visite a su equipo de atencin para que revise su evolucin peridicamente. Revise su presin arterial como se le indique. Pregunte a su equipo de atencin cul debe ser su presin arterial. Tambin averige cundo deber contactarlos. No se trate usted mismo si tiene tos, resfriado o Nutritional therapist est usando este medicamento sin consultar con su equipo de atencin. Algunos medicamentos pueden aumentar su presin arterial. Puede experimentar somnolencia o mareos. No conduzca, no utilice maquinaria ni haga nada que Associate Professor en estado de alerta hasta que sepa cmo le afecta este medicamento. No se siente ni se ponga de pie con rapidez, especialmente si es un paciente de edad avanzada. Esto reduce el riesgo de mareos o [employer]. El alcohol podra interferir con el efecto de este medicamento. Evite consumir bebidas alcohlicas. Este medicamento puede aumentar los niveles de Dispensing optician. Pregntele a su equipo de atencin si es Chartered loss adjuster cambios en la dieta o en los medicamentos si usted tiene diabetes. Qu efectos secundarios puedo tener al [employer] Corporation este medicamento? Efectos secundarios que debe informar a su equipo de atencin tan pronto como sea posible: Reacciones alrgicas: erupcin cutnea, comezn/picazn, urticaria, hinchazn de la cara, los labios, la lengua o la garganta Insuficiencia cardiaca: falta  de aire, hinchazn de los tobillos, los pies o las manos, aumento de peso repentino, debilidad o fatiga inusuales Presin arterial baja: mareo, sensacin de desmayo o aturdimiento, visin borrosa Enfermedad de Raynaud: dedos de las manos o los pies fros, entumecidos o dolorosos que pueden cambiar de color plido, a Psychologist, educational, a rojo Youth worker lenta: mareos, sensacin de desmayo o aturdimiento, confusin, problemas para respirar, debilidad o fatiga inusuales Empeoramiento del estado de nimo, sentimientos de depresin Efectos secundarios que generalmente no requieren atencin mdica (debe informarlos a su equipo de atencin si persisten o si son molestos): Cambios en el deseo o desempeo sexual Diarrea Mareos Fatiga Dolor de cabeza Puede ser que esta lista no menciona todos los posibles efectos secundarios. Comunquese a su mdico por asesoramiento mdico [employer] Inc. Usted puede informar los efectos secundarios a la FDA por telfono al 1-800-FDA-1088. Dnde debo guardar mi medicina? Mantenga fuera del alcance de nios y Copy. Guarde a [employer], entre 15 y 72 grados Celsius (31 y 60 grados Fahrenheit). Proteja de  la humedad. Mantenga el recipiente bien cerrado. Deseche todo el medicamento que no haya utilizado despus de la fecha de vencimiento. ATENCIN: Este folleto es un resumen. Puede ser que no cubra toda la posible informacin. Si usted tiene preguntas acerca de esta medicina, consulte con su mdico, su farmacutico o su profesional de Radiographer, therapeutic.  2023 Elsevier/Gold Standard (2020-08-19 00:00:00)   Palpitaciones Palpitations Las palpitaciones son sensaciones de que su latido cardaco no es normal. El latido cardaco puede sentirse de las siguientes maneras: Desparejo (irregular). Ms rpido de lo normal. Como un aleteo. Que saltea un latido. Generalmente no es un problema grave. Sin embargo, un mdico le har pruebas y examinar sus antecedentes  mdicos para asegurarse de que no tiene un problema cardaco grave. Siga estas instrucciones en su casa: Controle la afeccin para detectar cualquier cambio. Informe al mdico si hay algn cambio. Tome estas medidas para ayudar a Chief Operating Officer sus sntomas: Comida y bebida Siga las instrucciones del mdico sobre las cosas que puede comer y beber. Tal vez le indiquen que evite estas cosas: Bebidas que contengan [place], [place], t, refrescos y bebidas energizantes. Chocolate. Alcohol. Pastillas para adelgazar. Estilo de vida     Intente reducir [employer]ney de estrs. Estas cosas pueden ayudarlo a relajarse: Yoga. Respiracin profunda y meditacin. Imgenes guiadas. Esto es usar palabras e imgenes para crear pensamientos positivos. Ejercicio, como nadar, trotar y Advertising account planner. Informe a su mdico si tiene ms latidos cardacos anormales cuando est activo. Si siente dolor en el pecho o le falta el aire con el ejercicio, no siga haciendo ejercicio hasta que lo vea el mdico. Biorretroalimentacin. Esto consiste en usar la mente para controlar cosas del cuerpo, como el latido cardaco. Descanse y duerma lo suficiente. Mantenga un horario habitual para acostarse. No consuma drogas, como cocana o xtasis. No consuma marihuana. No fume ni consuma ningn producto que contenga nicotina o tabaco. Si necesita ayuda para dejar de fumar, consulte al mdico. Instrucciones generales Use los medicamentos de venta libre y los recetados solamente como se lo haya indicado el mdico. Oceanographer a todas las visitas de seguimiento. Es probable que necesite ms pruebas si las palpitaciones no desaparecen o empeoran. Comunquese con un mdico si: Sigue teniendo latidos cardacos rpidos o Comptroller. Los sntomas suceden con ms frecuencia. Solicite ayuda de inmediato si: Midwife. Le falta el aire. Tiene un dolor de cabeza muy intenso. Siente mareos. Se desmaya. Estos sntomas  pueden Customer service manager. Solicite ayuda de inmediato. Comunquese con el servicio de emergencias de su localidad (911 en los [place]). No espere a ver si los sntomas desaparecen. No conduzca por sus propios medios [employer] hospital. Resumen Las palpitaciones son sensaciones de que su latido cardaco es irregular o ms rpido de lo normal. Se siente como un aleteo o que falta un latido. Evite los alimentos y las bebidas que puedan causarle esta afeccin. Entre ellos se incluyen la cafena, el chocolate y las bebidas alcohlicas. Intente reducir J. [employer]. Penney de estrs. No fume ni consuma drogas. Obtenga ayuda de inmediato si se desmaya, se siente mareado, tiene falta de aire, tiene dolor de pecho o tiene dolor de cabeza muy intenso. Esta informacin no tiene Theme park manager el consejo del mdico. Asegrese de hacerle al mdico cualquier pregunta que tenga. Document Revised: 07/09/2020 Document Reviewed: 07/09/2020 Elsevier Patient Education  2023 [employer].

## 2022-03-12 NOTE — Progress Notes (Unsigned)
Enrolled for [employer] to mail a ZIO XT long term holter monitor to the patients address on file.  

## 2022-03-20 DIAGNOSIS — R002 Palpitations: Secondary | ICD-10-CM

## 2022-04-10 ENCOUNTER — Other Ambulatory Visit: Payer: Self-pay

## 2022-04-10 ENCOUNTER — Ambulatory Visit: Payer: Self-pay | Attending: Nurse Practitioner | Admitting: Nurse Practitioner

## 2022-04-10 ENCOUNTER — Encounter: Payer: Self-pay | Admitting: Nurse Practitioner

## 2022-04-10 VITALS — BP 104/72 | HR 71 | Ht 59.0 in | Wt 142.8 lb

## 2022-04-10 DIAGNOSIS — I491 Atrial premature depolarization: Secondary | ICD-10-CM

## 2022-04-10 DIAGNOSIS — Z1231 Encounter for screening mammogram for malignant neoplasm of breast: Secondary | ICD-10-CM

## 2022-04-10 DIAGNOSIS — R002 Palpitations: Secondary | ICD-10-CM

## 2022-04-10 MED ORDER — METOPROLOL TARTRATE 25 MG PO TABS: 12.5000 mg | ORAL_TABLET | Freq: Two times a day (BID) | ORAL | 3 refills | Status: AC

## 2022-04-10 NOTE — Progress Notes (Signed)
Office Visit    Patient Name: Monique Hutchinson Date of Encounter: 04/10/2022  Primary Care Provider:  Mack Hook, MD Primary Cardiologist:  Minus Breeding, MD  Chief Complaint    47 year old female with a history of palpitations, GERD, osteomyelitis, and gestational diabetes who presents for follow-up related to palpitations.  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   S/p surgery    Past Surgical History:  Procedure Laterality Date   CESAREAN SECTION  2010   CESAREAN SECTION N/A 12/03/2016   Procedure: CESAREAN SECTION;  Surgeon: Aletha Halim, MD;  Location: [place];  Service: Obstetrics;  Laterality: N/A;   CHOLECYSTECTOMY  2010   Laparoscopic   LEG SURGERY Right Age 51-6 years old   Due to osteomyelitis of right tibia    Allergies  No Known Allergies   Labs/Other Studies Reviewed    The following studies were reviewed today: None  Recent Labs: 03/11/2022: BUN 7; Creatinine, Ser 0.58; Hemoglobin 14.4; Platelets 387; Potassium 4.3; Sodium 138 03/12/2022: TSH 1.190  Recent Lipid Panel No results found for: "CHOL", "TRIG", "HDL", "CHOLHDL", "VLDL", "LDLCALC", "LDLDIRECT"  History of Present Illness    47 year old female with the above past medical history including palpitations, GERD, osteomyelitis, and gestational diabetes.  She was seen in the ED on 03/11/2022 in the setting of palpitations at began in January 2024. In the ED she was noted to have PACs in a bigeminal pattern. She was referred to cardiology. She was last seen in the office on 03/12/2022 and was stable from a cardiac standpoint.  She noted ongoing intermittent  palpitation which she described as "skipping beats" or a jumping sensation as well as the need to cough.  She denied any other associated symptoms.  TSH was normal.  Of note, her i-STAT beta hCG was elevated in the ED.  Patient did not think she was pregnant at the time.  14-day ZIO was recommended, results pending.  She was started on metoprolol 12.5 mg twice daily.  She is today for follow-up accompanied by Spanish interpreter.  Since her last visit she has been stable from a cardiac standpoint.  She continues to note intermittent palpitations, denies any associated symptoms.  Overall, her symptoms have improved on metoprolol, she seems to be tolerating the medication well.  She notes she is not pregnant.  She brought her ZIO monitor into the office today and states she did not under the understanding instructions for sending it back.  Overall, she reports feeling well.  Home Medications    Current Outpatient Medications  Medication Sig Dispense Refill   cyclobenzaprine (FLEXERIL) 5 MG tablet Take 5 mg by mouth at bedtime as needed.     ibuprofen (ADVIL) 600 MG tablet Take 1 tablet (600 mg total) by mouth every 6 (six) hours as needed. 30 tablet 0   metoprolol tartrate (LOPRESSOR) 25 MG tablet Take 0.5 tablets (12.5 mg total) by mouth 2 (two) times daily. 90 tablet 3   No current facility-administered medications for this visit.     Review of Systems    She denies chest pain, dyspnea, pnd, orthopnea, n, v, dizziness, syncope, edema, weight gain, or early  satiety. All other systems reviewed and are otherwise negative except as noted above.   Physical Exam    VS:  BP 104/72   Pulse 71   Ht '4\' 11"'$  (1.499 m)   Wt 142 lb 12.8 oz (64.8 kg)   SpO2 99%   BMI 28.84 kg/m   GEN: Well nourished, well developed, in no acute distress. HEENT: normal. Neck: Supple, no JVD, carotid bruits, or masses. Cardiac: RRR, no murmurs, rubs, or gallops. No clubbing, cyanosis, edema.  Radials/DP/PT 2+ and equal  bilaterally.  Respiratory:  Respirations regular and unlabored, clear to auscultation bilaterally. GI: Soft, nontender, nondistended, BS + x 4. MS: no deformity or atrophy. Skin: warm and dry, no rash. Neuro:  Strength and sensation are intact. Psych: Normal affect.  Accessory Clinical Findings    ECG personally reviewed by me today - No EKG in office today.    Lab Results  Component Value Date   WBC 10.3 03/11/2022   HGB 14.4 03/11/2022   HCT 43.5 03/11/2022   MCV 83.7 03/11/2022   PLT 387 03/11/2022   Lab Results  Component Value Date   CREATININE 0.58 03/11/2022   BUN 7 03/11/2022   NA 138 03/11/2022   K 4.3 03/11/2022   CL 104 03/11/2022   CO2 23 03/11/2022   Lab Results  Component Value Date   ALT 15 07/15/2016   AST 21 07/15/2016   ALKPHOS 64 07/15/2016   BILITOT <0.2 07/15/2016   No results found for: "CHOL", "HDL", "LDLCALC", "LDLDIRECT", "TRIG", "CHOLHDL"  No results found for: "HGBA1C"  Assessment & Plan    1. Palpitations/PACs: ZIO results pending-she plans to send her monitor back today.  She notes intermittent palpitations, no associated symptoms.  Stable and improved overall on metoprolol.  She declines titration of metoprolol today.  Continue to monitor symptoms. Continue metoprolol.   2. Elevated beta-hCG: Patient states she recently had her period and is not pregnant..  3. Disposition: Follow-up in 6 months.         Lenna Sciara, NP 04/10/2022, 10:17 AM

## 2022-04-10 NOTE — Patient Instructions (Addendum)
Medication Instructions:  Your physician recommends that you continue on your current medications as directed. Please refer to the Current Medication list given to you today.   *If you need a refill on your cardiac medications before your next appointment, please call your pharmacy*   Lab Work: NONE ordered at this time of appointment   If you have labs (blood work) drawn today and your tests are completely normal, you will receive your results only by: [place] (if you have MyChart) OR A paper copy in the mail If you have any lab test that is abnormal or we need to change your treatment, we will call you to review the results.   Testing/Procedures: NONE ordered at this time of appointment     Follow-Up: At [facility], you and your health needs are our priority.  As part of our continuing mission to provide you with exceptional heart care, we have created designated Provider Care Teams.  These Care Teams include your primary Cardiologist (physician) and Advanced Practice Providers (APPs -  Physician Assistants and Nurse Practitioners) who all work together to provide you with the care you need, when you need it.  We recommend signing up for the patient portal called "MyChart".  Sign up information is provided on this After Visit Summary.  MyChart is used to connect with patients for Virtual Visits (Telemedicine).  Patients are able to view lab/test results, encounter notes, upcoming appointments, etc.  Non-urgent messages can be sent to your provider as well.   To learn more about what you can do with MyChart, go to [name].    Your next appointment:   6-7 month(s)  Provider:   Minus Breeding, MD     Other Instructions

## 2022-04-14 ENCOUNTER — Telehealth: Payer: Self-pay | Admitting: Licensed Clinical Social Worker

## 2022-04-14 NOTE — Telephone Encounter (Signed)
H&V Care Navigation CSW Progress Note  Clinical Social Worker contacted patient by phone to f/u after appt at [employer]. Pt currently uninsured, called to assess for assistance. No answer this morning with assistance of [facility], [phone], called twice. Will re-attempt again this week.   Patient is participating in a Managed Medicaid Plan:  No, self pay only.   SDOH Screenings   Food Insecurity: No Food Insecurity (04/24/2021)  Transportation Needs: No Transportation Needs (04/24/2021)  Tobacco Use: Low Risk  (04/10/2022)    Westley Hummer, MSW, [place]  [phone]- work cell phone (preferred) [phone]- desk phone

## 2022-04-15 ENCOUNTER — Telehealth: Payer: Self-pay | Admitting: Licensed Clinical Social Worker

## 2022-04-15 NOTE — Telephone Encounter (Signed)
H&V Care Navigation CSW Progress Note  Clinical Social Worker contacted patient by phone to f/u after appt at [employer]. Pt currently uninsured, called to assess for assistance. No answer again today with assistance of [employer] [phone]. Will attempt again next week and if no answer will mail resources.   Patient is participating in a Managed Medicaid Plan: No, self pay only  [place]: No Food Insecurity (04/24/2021)  Transportation Needs: No Transportation Needs (04/24/2021)  Tobacco Use: Low Risk  (04/10/2022)    Westley Hummer, MSW, [place]  [phone]- work cell phone (preferred) [phone]- desk phone

## 2022-04-20 ENCOUNTER — Telehealth: Payer: Self-pay | Admitting: Licensed Clinical Social Worker

## 2022-04-20 NOTE — Telephone Encounter (Signed)
H&V Care Navigation CSW Progress Note  Clinical Social Worker contacted patient by phone to f/u again after appt at [employer]. Pt currently uninsured, called to assess for assistance. No answer again today with assistance of [facility] [phone]. I left voicemail letting pt know that I had tried to reach her several times and was going to mail her assistance applications. If interested in completing them/if she needed assistance doing so she could call me. Mailed pt my card, [employer] and CAFA in [place], and letter of support in Spanish if needed. I remain available.    Patient is participating in a Managed Medicaid Plan:  No, self pay only.   SDOH Screenings   Food Insecurity: No Food Insecurity (04/24/2021)  Transportation Needs: No Transportation Needs (04/24/2021)  Tobacco Use: Low Risk  (04/10/2022)    Westley Hummer, MSW, [place]  [phone]- work cell phone (preferred) [phone]- desk phone

## 2022-05-14 ENCOUNTER — Ambulatory Visit
Admission: RE | Admit: 2022-05-14 | Discharge: 2022-05-14 | Disposition: A | Discharge status: Home / Self Care | Payer: No Typology Code available for payment source | Source: Ambulatory Visit | Attending: Obstetrics and Gynecology | Admitting: Obstetrics and Gynecology

## 2022-05-14 ENCOUNTER — Ambulatory Visit: Payer: Self-pay | Admitting: Hematology and Oncology

## 2022-05-14 VITALS — BP 115/80 | Wt 143.0 lb

## 2022-05-14 DIAGNOSIS — Z1211 Encounter for screening for malignant neoplasm of colon: Secondary | ICD-10-CM

## 2022-05-14 DIAGNOSIS — Z1231 Encounter for screening mammogram for malignant neoplasm of breast: Secondary | ICD-10-CM

## 2022-05-14 NOTE — Progress Notes (Signed)
[name] is Hutchinson 47 y.o. female who presents to [facility] clinic today with no complaints.    Pap Smear: Pap not smear completed today. Last Pap smear was 2022 and was normal. Per patient has no history of an abnormal Pap smear. Last Pap smear result is available in Epic.   Physical exam: Breasts Breasts symmetrical. No skin abnormalities bilateral breasts. No nipple retraction bilateral breasts. No nipple discharge bilateral breasts. No lymphadenopathy. No lumps palpated bilateral breasts.    MS DIGITAL SCREENING TOMO BILATERAL  Result Date: 04/25/2021 CLINICAL DATA:  Screening. EXAM: DIGITAL SCREENING BILATERAL MAMMOGRAM WITH TOMOSYNTHESIS AND CAD TECHNIQUE: Bilateral screening digital craniocaudal and mediolateral oblique mammograms were obtained. Bilateral screening digital breast tomosynthesis was performed. The images were evaluated with computer-aided detection. COMPARISON:  Previous exam(s). ACR Breast Density Category c: The breast tissue is heterogeneously dense, which may obscure small masses. FINDINGS: There are no findings suspicious for malignancy. IMPRESSION: No mammographic evidence of malignancy. Hutchinson result letter of this screening mammogram will be mailed directly to the patient. RECOMMENDATION: Screening mammogram in one year. (Code:SM-B-01Y) BI-RADS CATEGORY  1: Negative. Electronically Signed   By: Monique Hutchinson M.D.   On: 04/25/2021 08:28   MS DIGITAL DIAG TOMO UNI LEFT  Result Date: 10/17/2020 CLINICAL DATA:  47 year old female with diffuse LEFT breast pain for 10 months. EXAM: DIGITAL DIAGNOSTIC UNILATERAL LEFT MAMMOGRAM WITH TOMOSYNTHESIS AND CAD TECHNIQUE: Left digital diagnostic mammography and breast tomosynthesis was performed. The images were evaluated with computer-aided detection. COMPARISON:  Previous exam(s). ACR Breast Density Category c: The breast tissue is heterogeneously dense, which may obscure small masses. FINDINGS: 2D/3D full field and spot compression  views of the LEFT breast demonstrate no suspicious mass, distortion or worrisome calcifications. IMPRESSION: No mammographic evidence of LEFT breast malignancy. RECOMMENDATION: Bilateral screening mammogram in 5 months to resume annual mammogram schedule. I have discussed the findings, causes and remedies of breast pain and recommendations with the patient. If applicable, Hutchinson reminder letter will be sent to the patient regarding the next appointment. BI-RADS CATEGORY  1: Negative. Electronically Signed   By: Monique Hutchinson M.D.   On: 10/17/2020 11:18  MS DIGITAL SCREENING TOMO BILATERAL  Result Date: 04/01/2020 CLINICAL DATA:  Screening. EXAM: DIGITAL SCREENING BILATERAL MAMMOGRAM WITH TOMOSYNTHESIS AND CAD TECHNIQUE: Bilateral screening digital craniocaudal and mediolateral oblique mammograms were obtained. Bilateral screening digital breast tomosynthesis was performed. The images were evaluated with computer-aided detection. COMPARISON:  None. ACR Breast Density Category c: The breast tissue is heterogeneously dense, which may obscure small masses FINDINGS: There are no findings suspicious for malignancy. The images were evaluated with computer-aided detection. IMPRESSION: No mammographic evidence of malignancy. Hutchinson result letter of this screening mammogram will be mailed directly to the patient. RECOMMENDATION: Screening mammogram in one year. (Code:SM-B-01Y) BI-RADS CATEGORY  1: Negative. Electronically Signed   By: Monique Hutchinson M.D.   On: 04/01/2020 15:59       Pelvic/Bimanual Pap is not indicated today    Smoking History: Patient has never smoked and was not referred to quit line.    Patient Navigation: Patient education provided. Access to services provided for patient through [facility] program. Alis interpreter provided. No transportation provided   Colorectal Cancer Screening: Per patient has never had colonoscopy completed No complaints today. FIT test given.   Breast and Cervical Cancer Risk  Assessment: Patient does not have family history of breast cancer, known genetic mutations, or radiation treatment to the chest before age 60. Patient does not have  history of cervical dysplasia, immunocompromised, or DES exposure in-utero.  Risk Assessment   No risk assessment data for the current encounter  Risk Scores       04/24/2021   Last edited by: Monique Rutherford, LPN   5-year risk: 0.7 %   Lifetime risk: 9.4 %            Hutchinson: BCCCP exam without pap smear No complaints with benign exam.   P: Referred patient to the [facility] Center of [facility] for Hutchinson screening mammogram. Appointment scheduled 05/14/22.  Monique Basset A, NP 05/14/2022 2:02 PM

## 2022-05-14 NOTE — Patient Instructions (Signed)
Taught Monique Hutchinson about self breast awareness and gave educational materials to take home. Patient did not need a Pap smear today due to last Pap smear was in 2022 per patient.  Let her know BCCCP will cover Pap smears every 5 years unless has a history of abnormal Pap smears. Referred patient to the Breast Center of [facility] for screening mammogram. Appointment scheduled for 05/14/22. Patient aware of appointment and will be there. Let patient know will follow up with her within the next couple weeks with results. Alexei Garcia-Parada verbalized understanding.  Pascal Lux, NP 2:03 PM

## 2022-05-27 ENCOUNTER — Other Ambulatory Visit: Payer: Self-pay | Admitting: Hematology and Oncology

## 2022-05-27 ENCOUNTER — Telehealth: Payer: Self-pay | Admitting: Internal Medicine

## 2022-05-27 DIAGNOSIS — Z124 Encounter for screening for malignant neoplasm of cervix: Secondary | ICD-10-CM

## 2022-05-27 NOTE — Telephone Encounter (Signed)
Recv'd pt's [facility] OC app - called to sched financial counseling appt; sched Fri 05/29/2022 at 9 AM

## 2022-05-27 NOTE — Progress Notes (Signed)
Patient: Monique Hutchinson           Date of Birth: [birth year]           MRN: [id number] Visit Date: 05/27/2022 PCP: Julieanne Manson, MD  Cervical Cancer Screening Do you smoke?: No Have you ever had or been told you have an allergy to latex products?: No Marital status: Single Date of last pap smear: 1-2 yrs ago Date of last menstrual period: 05/18/22 Number of pregnancies: 4 Number of births: 3 Have you ever had any of the following? Hysterectomy: No Tubal ligation (tubes tied): No Abnormal bleeding: No Venereal warts: No A sex partner with venereal warts: No A high risk* sex partner: No  Cervical Exam Pap smear completed: Pap test Abnormal Observations: Normal exam. Recommendations: Will repeat in 5 years if normal and negative HPV.      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

## 2022-05-29 ENCOUNTER — Telehealth: Payer: Self-pay | Admitting: Internal Medicine

## 2022-05-29 ENCOUNTER — Ambulatory Visit: Payer: Self-pay | Attending: Internal Medicine

## 2022-06-01 LAB — CYTOLOGY - PAP
Adequacy: ABSENT
Comment: NEGATIVE
Diagnosis: NEGATIVE
High risk HPV: NEGATIVE

## 2022-07-13 NOTE — Telephone Encounter (Signed)
Completed - Called pt to discuss docs req'd for CAFA and [facility]N OC - pt missing ID, latest utility bill, 90 days of bank stmts for savings acct, and FNS award letter; pt given my email and mailing missing doc letter to pt

## 2022-10-09 ENCOUNTER — Telehealth: Payer: Self-pay | Admitting: Cardiology

## 2022-10-09 NOTE — Telephone Encounter (Signed)
Patient states she applied for financial assistance and is not sure if she is getting it. She says she was seen before by the office and did not receive a bill yet. She would like a call back to let her know.

## 2022-10-12 ENCOUNTER — Telehealth: Payer: Self-pay | Admitting: Licensed Clinical Social Worker

## 2022-10-12 NOTE — Telephone Encounter (Signed)
H&V Care Navigation CSW Progress Note  Clinical Social Worker contacted patient by phone to f/u on message received from scheduler. "Patient states she applied for financial assistance and is not sure if she is getting it. She says she was seen before by the office and did not receive a bill yet. She would like a call back to let her know."   Reviewed chart and contacted pt three times on 3/12, 3/13 and 3/18 regarding patient assistance. She had never contacted me back. Did meet with JT, former Artist and pt was approved for CAFA at 100%. I called pt this morning with assistance of Joselyn Glassman language interpreter [phone]. Called x 2 and no answer so left voicemail letting pt know she was approved, provided pt financial counseling number on voicemail as well and will mail her approval letter again.  Patient is participating in a Managed Medicaid Plan:  No, self pay only  SDOH Screenings   Food Insecurity: No Food Insecurity (05/14/2022)  Transportation Needs: No Transportation Needs (05/14/2022)  Tobacco Use: Low Risk  (05/14/2022)   Octavio Graves, MSW, LCSW Clinical Social Worker II [facility] Heart/Vascular Care Navigation  [phone]- work cell phone (preferred) [phone]- desk phone

## 2022-10-22 NOTE — Progress Notes (Signed)
Cardiology Office Note:   Date:  10/23/2022  ID:  Monique Hutchinson, DOB [birth year], MRN [id number] PCP: Julieanne Manson, MD  [facility] Providers Cardiologist:  Rollene Rotunda, MD {  History of Present Illness:   Monique Hutchinson is a 47 y.o. female  who was referred by the ED for evaluation of palpitations.   She wore a Zio patch and had no significant arrhythmia.  She had 2.5% of her beats were PACs.  She was taking her low-dose metoprolol but she really has not needed this.  She stopped some caffeine.  She denies any new cardiovascular symptoms.  She says her palpitations are much improved.  She stays busy working and taking care of 3 kids. The patient denies any new symptoms such as chest discomfort, neck or arm discomfort. There has been no new shortness of breath, PND or orthopnea. There have been no reported palpitations, presyncope or syncope.    ROS: As stated in the HPI and negative for all other systems.  Studies Reviewed:    EKG:   NA  Risk Assessment/Calculations:              Physical Exam:   VS:  BP 120/68 (BP Location: Left Arm, Patient Position: Sitting, Cuff Size: Normal)   Pulse (!) 56   Wt 146 lb 9.6 oz (66.5 kg)   SpO2 97%   BMI 29.61 kg/m    Wt Readings from Last 3 Encounters:  10/23/22 146 lb 9.6 oz (66.5 kg)  05/14/22 143 lb (64.9 kg)  04/10/22 142 lb 12.8 oz (64.8 kg)     GEN: Well nourished, well developed in no acute distress NECK: No JVD; No carotid bruits CARDIAC: RRR, no murmurs, rubs, gallops RESPIRATORY:  Clear to auscultation without rales, wheezing or rhonchi  ABDOMEN: Soft, non-tender, non-distended EXTREMITIES:  No edema; No deformity   ASSESSMENT AND PLAN:   Palpitations: We had a long discussion about ACEs.  She will use as needed beta-blockers and avoid caffeine.  No further imaging is indicated.       Follow up with me as needed.   Signed, Rollene Rotunda, MD

## 2022-10-23 ENCOUNTER — Ambulatory Visit: Payer: Self-pay | Attending: Cardiology | Admitting: Cardiology

## 2022-10-23 ENCOUNTER — Encounter: Payer: Self-pay | Admitting: Cardiology

## 2022-10-23 VITALS — BP 120/68 | HR 56 | Wt 146.6 lb

## 2022-10-23 DIAGNOSIS — R002 Palpitations: Secondary | ICD-10-CM

## 2022-10-23 NOTE — Patient Instructions (Signed)
    Follow-Up: At [facility], you and your health needs are our priority.  As part of our continuing mission to provide you with exceptional heart care, we have created designated Provider Care Teams.  These Care Teams include your primary Cardiologist (physician) and Advanced Practice Providers (APPs -  Physician Assistants and Nurse Practitioners) who all work together to provide you with the care you need, when you need it.  We recommend signing up for the patient portal called "MyChart".  Sign up information is provided on this After Visit Summary.  MyChart is used to connect with patients for Virtual Visits (Telemedicine).  Patients are able to view lab/test results, encounter notes, upcoming appointments, etc.  Non-urgent messages can be sent to your provider as well.   To learn more about what you can do with MyChart, go to [email or web address].    Your next appointment:   As needed

## 2022-10-30 ENCOUNTER — Ambulatory Visit: Payer: Self-pay | Admitting: Cardiology

## 2023-05-27 ENCOUNTER — Other Ambulatory Visit: Payer: Self-pay | Admitting: Obstetrics and Gynecology

## 2023-05-27 DIAGNOSIS — Z1231 Encounter for screening mammogram for malignant neoplasm of breast: Secondary | ICD-10-CM

## 2023-09-16 ENCOUNTER — Ambulatory Visit
Admission: RE | Admit: 2023-09-16 | Discharge: 2023-09-16 | Disposition: A | Discharge status: Home / Self Care | Payer: Self-pay | Source: Ambulatory Visit | Attending: Obstetrics and Gynecology | Admitting: Obstetrics and Gynecology

## 2023-09-16 ENCOUNTER — Ambulatory Visit: Payer: Self-pay | Admitting: *Deleted

## 2023-09-16 VITALS — BP 118/80 | Wt 148.9 lb

## 2023-09-16 DIAGNOSIS — Z1231 Encounter for screening mammogram for malignant neoplasm of breast: Secondary | ICD-10-CM

## 2023-09-16 DIAGNOSIS — Z1211 Encounter for screening for malignant neoplasm of colon: Secondary | ICD-10-CM

## 2023-09-16 DIAGNOSIS — Z1239 Encounter for other screening for malignant neoplasm of breast: Secondary | ICD-10-CM

## 2023-09-16 NOTE — Patient Instructions (Signed)
 Explained breast self awareness with Monique Hutchinson. Patient did not need a Pap smear today due to last Pap smear and HPV typing was 05/27/2022. Let her know BCCCP will cover Pap smears and HPV typing every 5 years unless has a history of abnormal Pap smears. Referred patient to the [facility] of [facility] for a screening mammogram on mobile unit. Appointment scheduled Thursday, September 16, 2023 at 1600. Patient aware of appointment and will be there. Let patient know the Breast Center will follow up with her within the next couple weeks with results of her mammogram by letter or phone. [name] verbalized understanding.  Zebulin Siegel, Wanda Ship, RN 3:41 PM

## 2023-09-16 NOTE — Progress Notes (Signed)
 Monique Hutchinson is a 48 y.o. female who presents to [facility] clinic today with no complaints.    Pap Smear: Pap smear not completed today. Last Pap smear was 05/27/2022 at [facility] clinic and was normal with negative HPV. Per patient has history of an abnormal Pap smear in 2015 that a repeat Pap smear was completed for follow-up that was normal. Patient stated she has had at least three normal Pap smears since abnormal Pap smear. Last Pap smear result is available in Epic.    Physical exam: Breasts Breasts symmetrical. No skin abnormalities bilateral breasts. No nipple retraction bilateral breasts. No nipple discharge bilateral breasts. No lymphadenopathy. No lumps palpated bilateral breasts. No complaints of pain or tenderness on exam.      MS 3D SCR MAMMO BILAT BR (aka MM) Result Date: 05/16/2022 CLINICAL DATA:  Screening. EXAM: DIGITAL SCREENING BILATERAL MAMMOGRAM WITH TOMOSYNTHESIS AND CAD TECHNIQUE: Bilateral screening digital craniocaudal and mediolateral oblique mammograms were obtained. Bilateral screening digital breast tomosynthesis was performed. The images were evaluated with computer-aided detection. COMPARISON:  Previous exam(s). ACR Breast Density Category c: The breasts are heterogeneously dense, which may obscure small masses. FINDINGS: There are no findings suspicious for malignancy. IMPRESSION: No mammographic evidence of malignancy. A result letter of this screening mammogram will be mailed directly to the patient. RECOMMENDATION: Screening mammogram in one year. (Code:SM-B-01Y) BI-RADS CATEGORY  1: Negative. Electronically Signed   By: Reyes Phi M.D.   On: 05/16/2022 09:40   MS DIGITAL SCREENING TOMO BILATERAL Result Date: 04/25/2021 CLINICAL DATA:  Screening. EXAM: DIGITAL SCREENING BILATERAL MAMMOGRAM WITH TOMOSYNTHESIS AND CAD TECHNIQUE: Bilateral screening digital craniocaudal and mediolateral oblique mammograms were obtained. Bilateral screening digital breast tomosynthesis  was performed. The images were evaluated with computer-aided detection. COMPARISON:  Previous exam(s). ACR Breast Density Category c: The breast tissue is heterogeneously dense, which may obscure small masses. FINDINGS: There are no findings suspicious for malignancy. IMPRESSION: No mammographic evidence of malignancy. A result letter of this screening mammogram will be mailed directly to the patient. RECOMMENDATION: Screening mammogram in one year. (Code:SM-B-01Y) BI-RADS CATEGORY  1: Negative. Electronically Signed   By: Lael Hines M.D.   On: 04/25/2021 08:28   MS DIGITAL DIAG TOMO UNI LEFT Result Date: 10/17/2020 CLINICAL DATA:  48 year old female with diffuse LEFT breast pain for 10 months. EXAM: DIGITAL DIAGNOSTIC UNILATERAL LEFT MAMMOGRAM WITH TOMOSYNTHESIS AND CAD TECHNIQUE: Left digital diagnostic mammography and breast tomosynthesis was performed. The images were evaluated with computer-aided detection. COMPARISON:  Previous exam(s). ACR Breast Density Category c: The breast tissue is heterogeneously dense, which may obscure small masses. FINDINGS: 2D/3D full field and spot compression views of the LEFT breast demonstrate no suspicious mass, distortion or worrisome calcifications. IMPRESSION: No mammographic evidence of LEFT breast malignancy. RECOMMENDATION: Bilateral screening mammogram in 5 months to resume annual mammogram schedule. I have discussed the findings, causes and remedies of breast pain and recommendations with the patient. If applicable, a reminder letter will be sent to the patient regarding the next appointment. BI-RADS CATEGORY  1: Negative. Electronically Signed   By: Reyes Phi M.D.   On: 10/17/2020 11:18  MS DIGITAL SCREENING TOMO BILATERAL Result Date: 04/01/2020 CLINICAL DATA:  Screening. EXAM: DIGITAL SCREENING BILATERAL MAMMOGRAM WITH TOMOSYNTHESIS AND CAD TECHNIQUE: Bilateral screening digital craniocaudal and mediolateral oblique mammograms were obtained. Bilateral  screening digital breast tomosynthesis was performed. The images were evaluated with computer-aided detection. COMPARISON:  None. ACR Breast Density Category c: The breast tissue is heterogeneously dense, which may  obscure small masses FINDINGS: There are no findings suspicious for malignancy. The images were evaluated with computer-aided detection. IMPRESSION: No mammographic evidence of malignancy. A result letter of this screening mammogram will be mailed directly to the patient. RECOMMENDATION: Screening mammogram in one year. (Code:SM-B-01Y) BI-RADS CATEGORY  1: Negative. Electronically Signed   By: Reyes Phi M.D.   On: 04/01/2020 15:59    Pelvic/Bimanual Pap is not indicated today per BCCCP guidelines.   Smoking History: Patient has never smoked.   Patient Navigation: Patient education provided. Access to services provided for patient through [place] program. Spanish interpreter Monique Hutchinson from [facility] provided.   Colorectal Cancer Screening: Per patient has never had colonoscopy completed. FIT Test given to patient to complete. No complaints today.    Breast and Cervical Cancer Risk Assessment: Patient does not have family history of breast cancer, known genetic mutations, or radiation treatment to the chest before age 14. Patient does not have history of cervical dysplasia, immunocompromised, or DES exposure in-utero.  Risk Scores as of Encounter on 09/16/2023     Monique Hutchinson           5-year 1.07%   Lifetime 11.77%   This patient is [place]/Latina but has no documented birth country, so the [place] model used data from [place] patients to calculate their risk score. Document a birth country in the Demographics activity for a more accurate score.         Last calculated by Logan Lyle BRAVO, CMA on 09/16/2023 at  3:39 PM        A: BCCCP exam without pap smear No complaints.  P: Referred patient to the [facility] of [facility] for a screening mammogram on mobile unit.  Appointment scheduled Thursday, September 16, 2023 at 1600.  Driscilla Wanda SQUIBB, RN 09/16/2023 3:41 PM
